# Patient Record
Sex: Female | Born: 1975
Health system: Southern US, Community
[De-identification: ages and names within clinical notes are randomized; demographics above are authoritative.]

## PROBLEM LIST (undated history)

## (undated) DIAGNOSIS — J45909 Unspecified asthma, uncomplicated: Secondary | ICD-10-CM

## (undated) DIAGNOSIS — T7840XA Allergy, unspecified, initial encounter: Secondary | ICD-10-CM

## (undated) DIAGNOSIS — K259 Gastric ulcer, unspecified as acute or chronic, without hemorrhage or perforation: Secondary | ICD-10-CM

## (undated) DIAGNOSIS — Z5189 Encounter for other specified aftercare: Secondary | ICD-10-CM

## (undated) DIAGNOSIS — D649 Anemia, unspecified: Secondary | ICD-10-CM

## (undated) DIAGNOSIS — A90 Dengue fever [classical dengue]: Secondary | ICD-10-CM

## (undated) HISTORY — DX: Allergy, unspecified, initial encounter: T78.40XA

## (undated) HISTORY — PX: RHINOPLASTY: SHX2354

## (undated) HISTORY — DX: Unspecified asthma, uncomplicated: J45.909

## (undated) HISTORY — DX: Gastric ulcer, unspecified as acute or chronic, without hemorrhage or perforation: K25.9

## (undated) HISTORY — DX: Anemia, unspecified: D64.9

## (undated) HISTORY — DX: Encounter for other specified aftercare: Z51.89

## (undated) HISTORY — DX: Dengue fever (classical dengue): A90

## (undated) HISTORY — PX: COLONOSCOPY: SHX174

---

## 2020-03-05 ENCOUNTER — Ambulatory Visit (INDEPENDENT_AMBULATORY_CARE_PROVIDER_SITE_OTHER): Payer: No Typology Code available for payment source | Admitting: Family Medicine

## 2020-03-05 ENCOUNTER — Encounter: Payer: Self-pay | Admitting: Internal Medicine

## 2020-03-05 ENCOUNTER — Encounter: Payer: Self-pay | Admitting: Family Medicine

## 2020-03-05 ENCOUNTER — Other Ambulatory Visit: Payer: Self-pay

## 2020-03-05 ENCOUNTER — Other Ambulatory Visit (HOSPITAL_COMMUNITY)
Admission: RE | Admit: 2020-03-05 | Discharge: 2020-03-05 | Disposition: A | Discharge status: Home / Self Care | Payer: No Typology Code available for payment source | Source: Ambulatory Visit | Attending: Family Medicine | Admitting: Family Medicine

## 2020-03-05 VITALS — BP 141/92 | HR 87 | Wt 147.8 lb

## 2020-03-05 DIAGNOSIS — Z1211 Encounter for screening for malignant neoplasm of colon: Secondary | ICD-10-CM

## 2020-03-05 DIAGNOSIS — Z1231 Encounter for screening mammogram for malignant neoplasm of breast: Secondary | ICD-10-CM | POA: Insufficient documentation

## 2020-03-05 DIAGNOSIS — Z01419 Encounter for gynecological examination (general) (routine) without abnormal findings: Secondary | ICD-10-CM

## 2020-03-05 NOTE — Patient Instructions (Signed)

## 2020-03-05 NOTE — Progress Notes (Signed)
GYNECOLOGY ANNUAL PREVENTATIVE CARE ENCOUNTER NOTE  Subjective:   Kathleen Doyle is a 44 y.o. G0P0000 female here for a annual gynecologic exam. Current complaints: constipation, for which she would like a GI referral.   Denies abnormal vaginal bleeding, discharge, pelvic pain, problems with intercourse or other gynecologic concerns.    Gynecologic History Patient's last menstrual period was 02/13/2020. Contraception: abstinence Last Pap: 2017. Results were: normal Last mammogram: 2017. Results were: normal (Family history of Breast Cancer, personal history fibrocystic breasts) Gardisil: not elligible  Obstetric History OB History  Gravida Para Term Preterm AB Living  0 0 0 0 0 0  SAB TAB Ectopic Multiple Live Births  0 0 0 0 0    Past Medical History:  Diagnosis Date  . Asthma     History reviewed. No pertinent surgical history.  Current Outpatient Medications on File Prior to Visit  Medication Sig Dispense Refill  . acetaminophen (TYLENOL) 325 MG tablet Take 650 mg by mouth every 6 (six) hours as needed.    Marland Kitchen ibuprofen (ADVIL) 200 MG tablet Take 400 mg by mouth every 6 (six) hours as needed.    . Magnesium Hydroxide (DULCOLAX PO) Take by mouth.    . Multiple Vitamin (MULTIVITAMIN PO) Take by mouth.     No current facility-administered medications on file prior to visit.    Allergies  Allergen Reactions  . Merthiolate [Thimerosal] Rash  . Penicillins Rash    Social History   Socioeconomic History  . Marital status: Divorced    Spouse name: Not on file  . Number of children: Not on file  . Years of education: Not on file  . Highest education level: Not on file  Occupational History  . Occupation: Surveyor, quantity: [employer]  Tobacco Use  . Smoking status: Never Smoker  . Smokeless tobacco: Never Used  Substance and Sexual Activity  . Alcohol use: Yes    Comment: occasionally  . Drug use: Never  . Sexual activity: Not Currently   Other Topics Concern  . Not on file  Social History Narrative  . Not on file   Social Determinants of Health   Financial Resource Strain:   . Difficulty of Paying Living Expenses:   Food Insecurity:   . Worried About Charity fundraiser in the Last Year:   . Arboriculturist in the Last Year:   Transportation Needs:   . Film/video editor (Medical):   Marland Kitchen Lack of Transportation (Non-Medical):   Physical Activity:   . Days of Exercise per Week:   . Minutes of Exercise per Session:   Stress:   . Feeling of Stress :   Social Connections:   . Frequency of Communication with Friends and Family:   . Frequency of Social Gatherings with Friends and Family:   . Attends Religious Services:   . Active Member of Clubs or Organizations:   . Attends Archivist Meetings:   Marland Kitchen Marital Status:   Intimate Partner Violence:   . Fear of Current or Ex-Partner:   . Emotionally Abused:   Marland Kitchen Physically Abused:   . Sexually Abused:     Family History  Problem Relation Age of Onset  . Kidney Stones Mother   . Heart disease Father   . Lung cancer Paternal Grandfather   . Brain cancer Paternal Grandfather   . Breast cancer Maternal Aunt   . Breast cancer Paternal Aunt     Diet:  mostly bread and rice Exercise: reports  The following portions of the patient's history were reviewed and updated as appropriate: allergies, current medications, past family history, past medical history, past social history, past surgical history and problem list.  Review of Systems Pertinent items are noted in HPI.   Objective:  BP (!) 141/92   Pulse 87   Wt 147 lb 12.8 oz (67 kg)   LMP 02/13/2020  CONSTITUTIONAL: Well-developed, well-nourished female in no acute distress.  HENT:  Normocephalic, atraumatic, External right and left ear normal. Oropharynx is clear and moist EYES: Conjunctivae and EOM are normal. Pupils are equal, round, and reactive to light. No scleral icterus.  NECK: Normal range  of motion, supple, no masses.  Normal thyroid.  SKIN: Skin is warm and dry. No rash noted. Not diaphoretic. No erythema. No pallor. NEUROLOGIC: Alert and oriented to person, place, and time. Normal reflexes, muscle tone coordination. No cranial nerve deficit noted. PSYCHIATRIC: Normal mood and affect. Normal behavior. Normal judgment and thought content. CARDIOVASCULAR: Normal heart rate noted, regular rhythm RESPIRATORY: Clear to auscultation bilaterally. Effort and breath sounds normal, no problems with respiration noted. BREASTS: Symmetric in size. No masses, skin changes, nipple drainage, or lymphadenopathy. ABDOMEN: Soft, normal bowel sounds, no distention noted.  No tenderness, rebound or guarding.  PELVIC: Normal appearing external genitalia; normal appearing vaginal mucosa and cervix.  No abnormal discharge noted.  Pap smear obtained.  Normal uterine size, no other palpable masses, no uterine or adnexal tenderness. MUSCULOSKELETAL: Normal range of motion. No tenderness.  No cyanosis, clubbing, or edema.  2+ distal pulses.  Exam done with chaperone present.  Assessment and Plan:   1. Encounter for screening mammogram for breast cancer - MM 3D SCREEN BREAST BILATERAL; Future - Cytology - PAP( [place])  2. Encounter for annual routine gynecological examination Will follow up results of pap smear and manage accordingly. Encouraged improvement in diet and exercise.  -Mammogram ordered -Flu vaccine: already had for 2020-2021 -Colonoscopy: referred -Gardisil n/a  Routine preventative health maintenance measures emphasized. Please refer to After Visit Summary for other counseling recommendations.   Total face-to-face time with patient: 13 minutes. Over 50% of encounter was spent on counseling and coordination of care.   Merilyn Baba, DO OB Fellow, [facility] 03/05/2020 9:44 AM

## 2020-03-06 ENCOUNTER — Other Ambulatory Visit: Payer: Self-pay | Admitting: Family Medicine

## 2020-03-06 DIAGNOSIS — Z1231 Encounter for screening mammogram for malignant neoplasm of breast: Secondary | ICD-10-CM

## 2020-03-07 LAB — CYTOLOGY - PAP
Comment: NEGATIVE
Diagnosis: NEGATIVE
High risk HPV: NEGATIVE

## 2020-03-10 ENCOUNTER — Ambulatory Visit
Admission: RE | Admit: 2020-03-10 | Discharge: 2020-03-10 | Disposition: A | Discharge status: Home / Self Care | Payer: No Typology Code available for payment source | Source: Ambulatory Visit | Attending: Family Medicine | Admitting: Family Medicine

## 2020-03-10 ENCOUNTER — Other Ambulatory Visit: Payer: Self-pay

## 2020-03-10 DIAGNOSIS — Z1231 Encounter for screening mammogram for malignant neoplasm of breast: Secondary | ICD-10-CM

## 2020-03-12 DIAGNOSIS — Z8711 Personal history of peptic ulcer disease: Secondary | ICD-10-CM | POA: Insufficient documentation

## 2020-03-12 DIAGNOSIS — R030 Elevated blood-pressure reading, without diagnosis of hypertension: Secondary | ICD-10-CM | POA: Insufficient documentation

## 2020-03-12 DIAGNOSIS — J4520 Mild intermittent asthma, uncomplicated: Secondary | ICD-10-CM | POA: Insufficient documentation

## 2020-03-21 ENCOUNTER — Other Ambulatory Visit: Payer: Self-pay

## 2020-03-21 ENCOUNTER — Ambulatory Visit (AMBULATORY_SURGERY_CENTER): Payer: Self-pay | Admitting: *Deleted

## 2020-03-21 VITALS — Temp 97.8°F | Ht 60.0 in | Wt 145.4 lb

## 2020-03-21 DIAGNOSIS — Z1211 Encounter for screening for malignant neoplasm of colon: Secondary | ICD-10-CM

## 2020-03-21 MED ORDER — SUPREP BOWEL PREP KIT 17.5-3.13-1.6 GM/177ML PO SOLN: 1.0000 | Freq: Once | ORAL | 0 refills | Status: AC

## 2020-03-21 NOTE — Progress Notes (Signed)
Patient denies any allergies to egg or soy products. Patient denies complications with anesthesia/sedation.  Patient denies oxygen use at home and denies diet medications. Emmi instructions for colonoscopy explained and given to patient.  Patient had both covid vaccines, last one on 01/14/20.

## 2020-04-04 ENCOUNTER — Encounter: Payer: Self-pay | Admitting: Internal Medicine

## 2020-04-04 ENCOUNTER — Ambulatory Visit (AMBULATORY_SURGERY_CENTER): Payer: No Typology Code available for payment source | Admitting: Internal Medicine

## 2020-04-04 ENCOUNTER — Other Ambulatory Visit: Payer: Self-pay

## 2020-04-04 VITALS — BP 122/81 | HR 76 | Temp 96.6°F | Resp 19 | Ht 60.0 in | Wt 145.0 lb

## 2020-04-04 DIAGNOSIS — D122 Benign neoplasm of ascending colon: Secondary | ICD-10-CM

## 2020-04-04 DIAGNOSIS — Z1211 Encounter for screening for malignant neoplasm of colon: Secondary | ICD-10-CM

## 2020-04-04 MED ORDER — SODIUM CHLORIDE 0.9 % IV SOLN: 500.0000 mL | Freq: Once | INTRAVENOUS | Status: DC

## 2020-04-04 NOTE — Progress Notes (Signed)
Report to PACU, RN, vss, BBS= Clear.  

## 2020-04-04 NOTE — Progress Notes (Signed)
Pt's states no medical or surgical changes since previsit or office visit. 

## 2020-04-04 NOTE — Progress Notes (Signed)
Called to room to assist during endoscopic procedure.  Patient ID and intended procedure confirmed with present staff. Received instructions for my participation in the procedure from the performing physician.  

## 2020-04-04 NOTE — Patient Instructions (Signed)
HANDOUTS PROVIDED ON: POLYPS  The polyp removed today have been sent for pathology.  The results can take 1-3 weeks to receive.  When your next colonoscopy should occur will be based on the pathology results.    You may resume your previous diet and medication schedule.  Thank you for allowing us to care for you today!!!   YOU HAD AN ENDOSCOPIC PROCEDURE TODAY AT THE [facility]:   Refer to the procedure report that was given to you for any specific questions about what was found during the examination.  If the procedure report does not answer your questions, please call your gastroenterologist to clarify.  If you requested that your care partner not be given the details of your procedure findings, then the procedure report has been included in a sealed envelope for you to review at your convenience later.  YOU SHOULD EXPECT: Some feelings of bloating in the abdomen. Passage of more gas than usual.  Walking can help get rid of the air that was put into your GI tract during the procedure and reduce the bloating. If you had a lower endoscopy (such as a colonoscopy or flexible sigmoidoscopy) you may notice spotting of blood in your stool or on the toilet paper. If you underwent a bowel prep for your procedure, you may not have a normal bowel movement for a few days.  Please Note:  You might notice some irritation and congestion in your nose or some drainage.  This is from the oxygen used during your procedure.  There is no need for concern and it should clear up in a day or so.  SYMPTOMS TO REPORT IMMEDIATELY:   Following lower endoscopy (colonoscopy or flexible sigmoidoscopy):  Excessive amounts of blood in the stool  Significant tenderness or worsening of abdominal pains  Swelling of the abdomen that is new, acute  Fever of 100F or higher  For urgent or emergent issues, a gastroenterologist can be reached at any hour by calling [phone]. Do not use MyChart messaging for  urgent concerns.    DIET:  We do recommend a small meal at first, but then you may proceed to your regular diet.  Drink plenty of fluids but you should avoid alcoholic beverages for 24 hours.  ACTIVITY:  You should plan to take it easy for the rest of today and you should NOT DRIVE or use heavy machinery until tomorrow (because of the sedation medicines used during the test).    FOLLOW UP: Our staff will call the number listed on your records 48-72 hours following your procedure to check on you and address any questions or concerns that you may have regarding the information given to you following your procedure. If we do not reach you, we will leave a message.  We will attempt to reach you two times.  During this call, we will ask if you have developed any symptoms of COVID 19. If you develop any symptoms (ie: fever, flu-like symptoms, shortness of breath, cough etc.) before then, please call [phone].  If you test positive for Covid 19 in the 2 weeks post procedure, please call and report this information to us.    If any biopsies were taken you will be contacted by phone or by letter within the next 1-3 weeks.  Please call us at [phone] if you have not heard about the biopsies in 3 weeks.    SIGNATURES/CONFIDENTIALITY: You and/or your care partner have signed paperwork which will be entered into   your electronic medical record.  These signatures attest to the fact that that the information above on your After Visit Summary has been reviewed and is understood.  Full responsibility of the confidentiality of this discharge information lies with you and/or your care-partner.

## 2020-04-04 NOTE — Op Note (Signed)
[facility] Patient Name: Kathleen Doyle Procedure Date: 04/04/2020 1:47 PM MRN: [id number] Endoscopist: Jerene Bears , MD Age: 44 Referring MD:  Date of Birth: 02/22/1976 Gender: Female Account #: [id number] Procedure:                Colonoscopy Indications:              Screening for colorectal malignant neoplasm, This                            is the patient's first colonoscopy Medicines:                Monitored Anesthesia Care Procedure:                Pre-Anesthesia Assessment:                           - Prior to the procedure, a History and Physical                            was performed, and patient medications and                            allergies were reviewed. The patient's tolerance of                            previous anesthesia was also reviewed. The risks                            and benefits of the procedure and the sedation                            options and risks were discussed with the patient.                            All questions were answered, and informed consent                            was obtained. Prior Anticoagulants: The patient has                            taken no previous anticoagulant or antiplatelet                            agents. ASA Grade Assessment: II - A patient with                            mild systemic disease. After reviewing the risks                            and benefits, the patient was deemed in                            satisfactory condition to undergo the procedure.  After obtaining informed consent, the colonoscope                            was passed under direct vision. Throughout the                            procedure, the patient's blood pressure, pulse, and                            oxygen saturations were monitored continuously. The                            Colonoscope was introduced through the anus and                            advanced to the cecum,  identified by appendiceal                            orifice and ileocecal valve. The colonoscopy was                            performed without difficulty. The patient tolerated                            the procedure well. The quality of the bowel                            preparation was good. The ileocecal valve,                            appendiceal orifice, and rectum were photographed. Scope In: 1:51:47 PM Scope Out: 2:06:53 PM Scope Withdrawal Time: 0 hours 12 minutes 0 seconds  Total Procedure Duration: 0 hours 15 minutes 6 seconds  Findings:                 The digital rectal exam was normal.                           A 10 mm polyp was found in the ascending colon. The                            polyp was sessile. The polyp was removed with a                            cold snare. Resection and retrieval were complete.                           The exam was otherwise without abnormality on                            direct and retroflexion views. Complications:            No immediate complications. Estimated Blood Loss:     Estimated blood loss was minimal. Impression:               -  One 10 mm polyp in the ascending colon, removed                            with a cold snare. Resected and retrieved.                           - The examination was otherwise normal on direct                            and retroflexion views. Recommendation:           - Patient has a contact number available for                            emergencies. The signs and symptoms of potential                            delayed complications were discussed with the                            patient. Return to normal activities tomorrow.                            Written discharge instructions were provided to the                            patient.                           - Resume previous diet.                           - Continue present medications.                           - Await  pathology results.                           - Repeat colonoscopy is recommended. The                            colonoscopy date will be determined after pathology                            results from today's exam become available for                            review. Jerene Bears, MD 04/04/2020 2:08:20 PM This report has been signed electronically.

## 2020-04-08 ENCOUNTER — Telehealth: Payer: Self-pay | Admitting: *Deleted

## 2020-04-08 NOTE — Telephone Encounter (Signed)
  Follow up Call-  Call back number 04/04/2020  Post procedure Call Back phone  # [phone]  Permission to leave phone message Yes     Patient questions:  Do you have a fever, pain , or abdominal swelling? No. Pain Score  0 *  Have you tolerated food without any problems? Yes.    Have you been able to return to your normal activities? Yes.    Do you have any questions about your discharge instructions: Diet   No. Medications  No. Follow up visit  No.  Do you have questions or concerns about your Care? No.  Actions: * If pain score is 4 or above: No action needed, pain <4.  Have you developed a fever since your procedure? NO  2.   Have you had an respiratory symptoms (SOB or cough) since your procedure? NO  3.   Have you tested positive for COVID 19 since your procedure NO  4.   Have you had any family members/close contacts diagnosed with the COVID 19 since your procedure?  NO   If yes to any of these questions please route to Joylene John, RN and Erenest Rasher, RN

## 2020-04-08 NOTE — Telephone Encounter (Signed)
  Follow up Call-  Call back number 04/04/2020  Post procedure Call Back phone  # [phone]  Permission to leave phone message Yes     Patient questions:  Message left to call us if necessary.

## 2020-04-14 ENCOUNTER — Encounter: Payer: Self-pay | Admitting: Internal Medicine

## 2021-03-18 ENCOUNTER — Other Ambulatory Visit: Payer: Self-pay | Admitting: Nurse Practitioner

## 2021-03-18 DIAGNOSIS — Z1231 Encounter for screening mammogram for malignant neoplasm of breast: Secondary | ICD-10-CM

## 2021-04-10 ENCOUNTER — Encounter: Payer: Self-pay | Admitting: Nurse Practitioner

## 2021-04-10 ENCOUNTER — Ambulatory Visit (INDEPENDENT_AMBULATORY_CARE_PROVIDER_SITE_OTHER): Payer: No Typology Code available for payment source | Admitting: Nurse Practitioner

## 2021-04-10 VITALS — BP 123/84 | HR 95 | Wt 143.3 lb

## 2021-04-10 DIAGNOSIS — Z01419 Encounter for gynecological examination (general) (routine) without abnormal findings: Secondary | ICD-10-CM

## 2021-04-10 DIAGNOSIS — Z6828 Body mass index (BMI) 28.0-28.9, adult: Secondary | ICD-10-CM | POA: Diagnosis not present

## 2021-04-10 DIAGNOSIS — Z860100 Personal history of colon polyps, unspecified: Secondary | ICD-10-CM | POA: Insufficient documentation

## 2021-04-10 NOTE — Progress Notes (Signed)
GYNECOLOGY ANNUAL PREVENTATIVE CARE ENCOUNTER NOTE  Subjective:   Kathleen Doyle is a 45 y.o. G0P0000 female here for a routine annual gynecologic exam.  Current complaints: aunts have been diagnosed with breast cancer - worried but has mammogram scheduled.  Previously has had ultrasounds with her mammogram.   Denies abnormal vaginal bleeding, discharge, pelvic pain, problems with intercourse or other gynecologic concerns.    Gynecologic History Patient's last menstrual period was 03/29/2021. Contraception: abstinence Last Pap: 2021. Results were: normal Last mammogram: 02-2020. Results were: normal  Obstetric History OB History  Gravida Para Term Preterm AB Living  0 0 0 0 0 0  SAB IAB Ectopic Multiple Live Births  0 0 0 0 0    Past Medical History:  Diagnosis Date  . Allergy   . Anemia   . Asthma    as a child, no inhaler no problems as an adult   . Dengue fever    hospitalized 2 weeks  . Multiple gastric ulcers     Past Surgical History:  Procedure Laterality Date  . RHINOPLASTY      Current Outpatient Medications on File Prior to Visit  Medication Sig Dispense Refill  . acetaminophen (TYLENOL) 325 MG tablet Take 650 mg by mouth every 6 (six) hours as needed.    Marland Kitchen ibuprofen (ADVIL) 200 MG tablet Take 400 mg by mouth every 6 (six) hours as needed.    . linaclotide (LINZESS) 72 MCG capsule Take 72 mcg by mouth daily before breakfast.    . Multiple Vitamin (MULTIVITAMIN PO) Take by mouth.     No current facility-administered medications on file prior to visit.    Allergies  Allergen Reactions  . Merthiolate [Thimerosal] Rash  . Penicillins Rash    Social History   Socioeconomic History  . Marital status: Divorced    Spouse name: Not on file  . Number of children: Not on file  . Years of education: Not on file  . Highest education level: Not on file  Occupational History  . Occupation: Surveyor, quantity: House  Tobacco Use   . Smoking status: Never Smoker  . Smokeless tobacco: Never Used  Vaping Use  . Vaping Use: Never used  Substance and Sexual Activity  . Alcohol use: Yes    Comment: occasionally  . Drug use: Never  . Sexual activity: Not Currently  Other Topics Concern  . Not on file  Social History Narrative  . Not on file   Social Determinants of Health   Financial Resource Strain: Not on file  Food Insecurity: Not on file  Transportation Needs: Not on file  Physical Activity: Not on file  Stress: Not on file  Social Connections: Not on file  Intimate Partner Violence: Not on file    Family History  Problem Relation Age of Onset  . Kidney Stones Mother   . Heart disease Father   . Lung cancer Paternal Grandfather   . Brain cancer Paternal Grandfather   . Breast cancer Maternal Aunt   . Breast cancer Paternal Aunt   . Colon cancer Neg Hx   . Stomach cancer Neg Hx   . Rectal cancer Neg Hx     The following portions of the patient's history were reviewed and updated as appropriate: allergies, current medications, past family history, past medical history, past social history, past surgical history and problem list.  Review of Systems Pertinent items noted in HPI and remainder of comprehensive ROS  otherwise negative.   Objective:  BP 123/84   Pulse 95   Wt 143 lb 4.8 oz (65 kg)   LMP 03/29/2021   BMI 27.99 kg/m  CONSTITUTIONAL: Well-developed, well-nourished female in no acute distress.  HENT:  Normocephalic, atraumatic, External right and left ear normal.  EYES: Conjunctivae and EOM are normal. Pupils are equal, round.  No scleral icterus.  NECK: Normal range of motion, supple, no masses.  Normal thyroid.  SKIN: Skin is warm and dry. No rash noted. Not diaphoretic. No erythema. No pallor. NEUROLOGIC: Alert and oriented to person, place, and time. Normal reflexes, muscle tone coordination. No cranial nerve deficit noted. PSYCHIATRIC: Normal mood and affect. Normal behavior.  Normal judgment and thought content. CARDIOVASCULAR: Normal heart rate noted, regular rhythm RESPIRATORY: Clear to auscultation bilaterally. Effort and breath sounds normal, no problems with respiration noted. BREASTS: Symmetric in size. No masses, skin changes, nipple drainage, or lymphadenopathy. ABDOMEN: Soft, no distention noted.  No tenderness, rebound or guarding.  PELVIC: deferred MUSCULOSKELETAL: Normal range of motion. No tenderness.  No cyanosis, clubbing, or edema.    Assessment and Plan:  1. Encounter for well woman exam with routine gynecological exam Sees PCP who is also watching her BP - First BP today was elevated.  Recheck was normal. Two aunts have breast cancer - has appt for mammogram and to see cardiology Breast exam normal so no indication for ultrasound with mammogram.  Advised mammography will let us know if there is a need for ultrasound. Works as Presenter, broadcasting at [employer]  2. BMI 28.0-28.9,adult Weight loss advised  Will follow up results of pap smear and manage accordingly. Mammogram scheduled Routine preventative health maintenance measures emphasized. Please refer to After Visit Summary for other counseling recommendations.    Earlie Server, RN, MSN, NP-BC Nurse Practitioner, [place] for [facility]

## 2021-05-07 ENCOUNTER — Ambulatory Visit
Admission: RE | Admit: 2021-05-07 | Discharge: 2021-05-07 | Disposition: A | Discharge status: Home / Self Care | Payer: No Typology Code available for payment source | Source: Ambulatory Visit | Attending: Nurse Practitioner | Admitting: Nurse Practitioner

## 2021-05-07 ENCOUNTER — Other Ambulatory Visit: Payer: Self-pay

## 2021-05-07 DIAGNOSIS — Z1231 Encounter for screening mammogram for malignant neoplasm of breast: Secondary | ICD-10-CM

## 2021-05-11 ENCOUNTER — Other Ambulatory Visit: Payer: Self-pay | Admitting: Nurse Practitioner

## 2021-05-11 DIAGNOSIS — R928 Other abnormal and inconclusive findings on diagnostic imaging of breast: Secondary | ICD-10-CM

## 2021-06-04 ENCOUNTER — Ambulatory Visit
Admission: RE | Admit: 2021-06-04 | Discharge: 2021-06-04 | Disposition: A | Discharge status: Home / Self Care | Payer: No Typology Code available for payment source | Source: Ambulatory Visit | Attending: Nurse Practitioner | Admitting: Nurse Practitioner

## 2021-06-04 ENCOUNTER — Other Ambulatory Visit: Payer: Self-pay

## 2021-06-04 DIAGNOSIS — R928 Other abnormal and inconclusive findings on diagnostic imaging of breast: Secondary | ICD-10-CM

## 2021-06-05 ENCOUNTER — Ambulatory Visit: Payer: No Typology Code available for payment source | Admitting: Cardiology

## 2021-06-05 ENCOUNTER — Encounter: Payer: Self-pay | Admitting: Cardiology

## 2021-06-05 VITALS — BP 120/90 | HR 86 | Ht 62.0 in | Wt 151.0 lb

## 2021-06-05 DIAGNOSIS — Z8249 Family history of ischemic heart disease and other diseases of the circulatory system: Secondary | ICD-10-CM | POA: Diagnosis not present

## 2021-06-05 DIAGNOSIS — Z8349 Family history of other endocrine, nutritional and metabolic diseases: Secondary | ICD-10-CM

## 2021-06-05 NOTE — Patient Instructions (Signed)
Medication Instructions:  Your physician recommends that you continue on your current medications as directed. Please refer to the Current Medication list given to you today.  *If you need a refill on your cardiac medications before your next appointment, please call your pharmacy*  Testing/Procedures: Your physician has requested that you have a cardiac MRI. Cardiac MRI uses a computer to create images of your heart as its beating, producing both still and moving pictures of your heart and major blood vessels. For further information please visit [email or web address]-[email or web address] Please follow the instruction sheet given to you today for more information.  Your physician has requested that you have Calcium score CT. Cardiac computed tomography (CT) is a painless test that uses an x-ray machine to take clear, detailed pictures of your heart. For further information please visit HugeFiesta.[email or web address]. Please follow instruction sheet as given.   Follow-Up: At [facility], you and your health needs are our priority.  As part of our continuing mission to provide you with exceptional heart care, we have created designated Provider Care Teams.  These Care Teams include your primary Cardiologist (physician) and Advanced Practice Providers (APPs -  Physician Assistants and Nurse Practitioners) who all work together to provide you with the care you need, when you need it.  Follow up with Dr. Radford Pax based on results of testing.

## 2021-06-05 NOTE — Progress Notes (Signed)
Cardiology CONSULT Note    Date:  06/05/2021   ID:  Kathleen Doyle, DOB [birth year], MRN [id number]  PCP:  Kathleen Boston, MD  Cardiologist:  Kathleen Him, MD   Chief Complaint  Patient presents with   New Patient (Initial Visit)    Family history of premature CAD and amyloidosis     History of Present Illness:  Kathleen Doyle is a 45 y.o. female who is being seen today for the evaluation of family history of CAD and  at the request of Kathleen, Jesse Sans, MD.    This is a 45yo female with a hx of asthma who is referred due to a strong family hx of premature CAD and cardiac amyloidosis.  Her father was dx with CAD in his 22's and then cardiac amyloid.  She has not had any cardiac symptoms and denies any chest pain or pressure, SOB, DOE, PND, orthopnea, LE edema, dizziness, palpitations or syncope.  She has never smoked.   Past Medical History:  Diagnosis Date   Allergy    Anemia    Asthma    as a child, no inhaler no problems as an adult    Dengue fever    hospitalized 2 weeks   Multiple gastric ulcers     Past Surgical History:  Procedure Laterality Date   RHINOPLASTY      Current Medications: Current Meds  Medication Sig   acetaminophen (TYLENOL) 325 MG tablet Take 650 mg by mouth every 6 (six) hours as needed.   ibuprofen (ADVIL) 200 MG tablet Take 400 mg by mouth every 6 (six) hours as needed.   linaclotide (LINZESS) 72 MCG capsule Take 72 mcg by mouth daily before breakfast.   Multiple Vitamin (MULTIVITAMIN PO) Take by mouth.    Allergies:   Merthiolate [thimerosal] and Penicillins   Social History   Socioeconomic History   Marital status: Divorced    Spouse name: Not on file   Number of children: Not on file   Years of education: Not on file   Highest education level: Not on file  Occupational History   Occupation: Registered Nurse    Employer: [employer]  Tobacco Use   Smoking status: Never   Smokeless tobacco: Never  Vaping Use    Vaping Use: Never used  Substance and Sexual Activity   Alcohol use: Yes    Comment: occasionally   Drug use: Never   Sexual activity: Not Currently  Other Topics Concern   Not on file  Social History Narrative   Not on file   Social Determinants of Health   Financial Resource Strain: Not on file  Food Insecurity: Not on file  Transportation Needs: Not on file  Physical Activity: Not on file  Stress: Not on file  Social Connections: Not on file     Family History:  The patient's family history includes Brain cancer in her paternal grandfather; Breast cancer in her maternal aunt and paternal aunt; Heart disease in her father; Kidney Stones in her mother; Lung cancer in her paternal grandfather.   ROS:   Please see the history of present illness.    ROS All other systems reviewed and are negative.  No flowsheet data found.     PHYSICAL EXAM:   VS:  BP 120/90   Pulse 86   Ht 5\' 2"  (1.575 m)   Wt 151 lb (68.5 kg)   SpO2 98%   BMI 27.62 kg/m    GEN: Well  nourished, well developed, in no acute distress  HEENT: normal  Neck: no JVD, carotid bruits, or masses Cardiac: RRR; no murmurs, rubs, or gallops,no edema.  Intact distal pulses bilaterally.  Respiratory:  clear to auscultation bilaterally, normal work of breathing GI: soft, nontender, nondistended, + BS MS: no deformity or atrophy  Skin: warm and dry, no rash Neuro:  Alert and Oriented x 3, Strength and sensation are intact Psych: euthymic mood, full affect  Wt Readings from Last 3 Encounters:  06/05/21 151 lb (68.5 kg)  04/10/21 143 lb 4.8 oz (65 kg)  04/04/20 145 lb (65.8 kg)      Studies/Labs Reviewed:   EKG:  EKG is ordered today.  The ekg ordered today demonstrates NSR with no ST changes  Recent Labs: No results found for requested labs within last 8760 hours.   Lipid Panel No results found for: CHOL, TRIG, HDL, CHOLHDL, VLDL, LDLCALC, LDLDIRECT  Additional studies/ records that were reviewed  today include:  EKG    ASSESSMENT:    1. Family history of early CAD   2. Family history of amyloidosis      PLAN:  In order of problems listed above:  Family hx of premature CAD -she is asymptomatic so I have recommended getting a coronary Ca score to assess future cardiac risk  2.  Family hx of cardiac amyloidosis -unclear if this was TTR amyloid -I will get a cardiac MRI to assess for structural heart disease and infiltrative myocardial disease  Time Spent: 30 minutes total time of encounter, including 20 minutes spent in face-to-face patient care on the date of this encounter. This time includes coordination of care and counseling regarding above mentioned problem list. Remainder of non-face-to-face time involved reviewing chart documents/testing relevant to the patient encounter and documentation in the medical record. I have independently reviewed documentation from referring provider  Medication Adjustments/Labs and Tests Ordered: Current medicines are reviewed at length with the patient today.  Concerns regarding medicines are outlined above.  Medication changes, Labs and Tests ordered today are listed in the Patient Instructions below.  There are no Patient Instructions on file for this visit.   Signed, Kathleen Him, MD  06/05/2021 8:57 AM    [facility] [address], [place], [place]  [address] Phone: [phone]; Fax: [phone]

## 2021-07-01 ENCOUNTER — Other Ambulatory Visit: Payer: Self-pay

## 2021-07-01 ENCOUNTER — Ambulatory Visit (INDEPENDENT_AMBULATORY_CARE_PROVIDER_SITE_OTHER)
Admission: RE | Admit: 2021-07-01 | Discharge: 2021-07-01 | Disposition: A | Discharge status: Home / Self Care | Payer: Self-pay | Source: Ambulatory Visit | Attending: Cardiology | Admitting: Cardiology

## 2021-07-01 DIAGNOSIS — Z8249 Family history of ischemic heart disease and other diseases of the circulatory system: Secondary | ICD-10-CM

## 2021-07-16 ENCOUNTER — Telehealth: Payer: Self-pay | Admitting: Cardiology

## 2021-07-16 NOTE — Telephone Encounter (Signed)
  Per patient schedule message:  I have a scheduled cardiac MRI on 8/26, I called my insurance and they said to be able to avail this you must send an updated request  because the 1st you submitted last 7/12 already expired.  They said its only valid for 30 days.    Please send it as soon as and the Case # [id number] for your reference.    Thank you very much!

## 2021-07-22 ENCOUNTER — Telehealth (HOSPITAL_COMMUNITY): Payer: Self-pay | Admitting: Emergency Medicine

## 2021-07-22 ENCOUNTER — Other Ambulatory Visit: Payer: Self-pay

## 2021-07-22 ENCOUNTER — Other Ambulatory Visit: Payer: No Typology Code available for payment source | Admitting: *Deleted

## 2021-07-22 DIAGNOSIS — Z8249 Family history of ischemic heart disease and other diseases of the circulatory system: Secondary | ICD-10-CM

## 2021-07-22 DIAGNOSIS — Z8349 Family history of other endocrine, nutritional and metabolic diseases: Secondary | ICD-10-CM

## 2021-07-22 LAB — BASIC METABOLIC PANEL
BUN/Creatinine Ratio: 15 (ref 9–23)
BUN: 10 mg/dL (ref 6–24)
CO2: 28 mmol/L (ref 20–29)
Calcium: 9.5 mg/dL (ref 8.7–10.2)
Chloride: 104 mmol/L (ref 96–106)
Creatinine, Ser: 0.65 mg/dL (ref 0.57–1.00)
Glucose: 87 mg/dL (ref 65–99)
Potassium: 4.2 mmol/L (ref 3.5–5.2)
Sodium: 139 mmol/L (ref 134–144)
eGFR: 109 mL/min/{1.73_m2} (ref >59)

## 2021-07-22 LAB — CBC
Hematocrit: 39.3 % (ref 34.0–46.6)
Hemoglobin: 13.2 g/dL (ref 11.1–15.9)
MCH: 28.6 pg (ref 26.6–33.0)
MCHC: 33.6 g/dL (ref 31.5–35.7)
MCV: 85 fL (ref 79–97)
Platelets: 282 10*3/uL (ref 150–450)
RBC: 4.62 x10E6/uL (ref 3.77–5.28)
RDW: 13.5 % (ref 11.7–15.4)
WBC: 7.2 10*3/uL (ref 3.4–10.8)

## 2021-07-22 NOTE — Telephone Encounter (Signed)
Attempted to call patient regarding upcoming cardiac CT appointment. °Left message on voicemail with name and callback number °Zakiya Sporrer RN Navigator Cardiac Imaging °[facility] °[phone] Office °[phone] Cell ° °

## 2021-07-24 ENCOUNTER — Ambulatory Visit (HOSPITAL_COMMUNITY)
Admission: RE | Admit: 2021-07-24 | Discharge: 2021-07-24 | Disposition: A | Discharge status: Home / Self Care | Payer: No Typology Code available for payment source | Source: Ambulatory Visit | Attending: Cardiology | Admitting: Cardiology

## 2021-07-24 ENCOUNTER — Encounter (HOSPITAL_COMMUNITY): Payer: Self-pay

## 2021-07-24 ENCOUNTER — Other Ambulatory Visit: Payer: Self-pay

## 2021-07-24 DIAGNOSIS — Z8349 Family history of other endocrine, nutritional and metabolic diseases: Secondary | ICD-10-CM

## 2021-07-24 DIAGNOSIS — Z8249 Family history of ischemic heart disease and other diseases of the circulatory system: Secondary | ICD-10-CM

## 2021-07-31 ENCOUNTER — Encounter: Payer: Self-pay | Admitting: *Deleted

## 2021-12-10 IMAGING — MG DIGITAL SCREENING BILAT W/ TOMO W/ CAD
6 of 10 series · 6 of 30 positions shown · non-contrast
Comparison: None.

CLINICAL DATA: Screening.

EXAM:
DIGITAL SCREENING BILATERAL MAMMOGRAM WITH TOMO AND CAD

[L CC synth-2D]
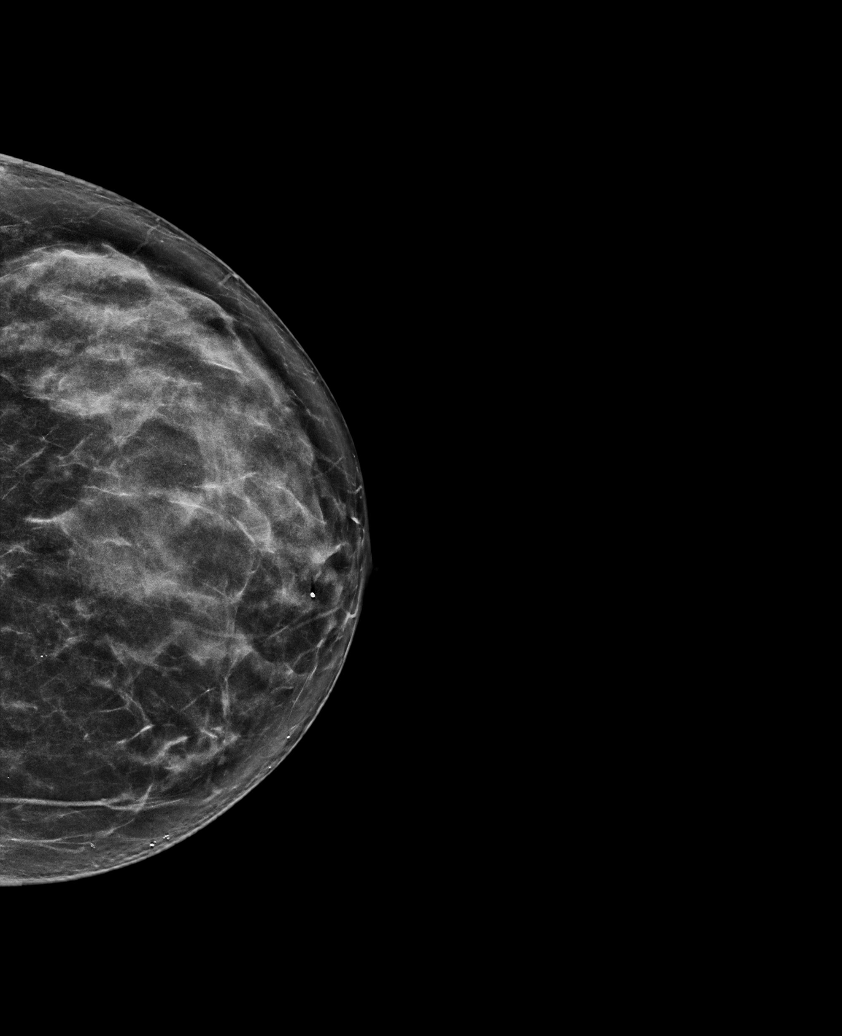

[R MLO synth-2D]
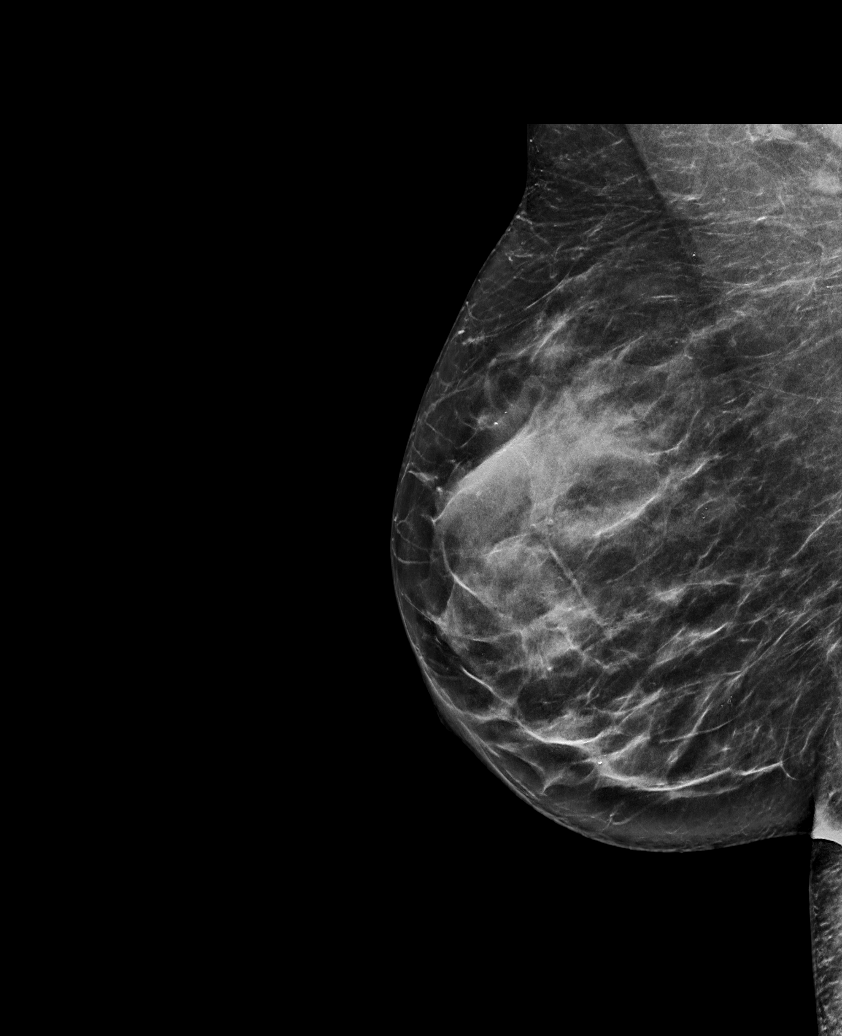

[R CC synth-2D (1 of 2)]
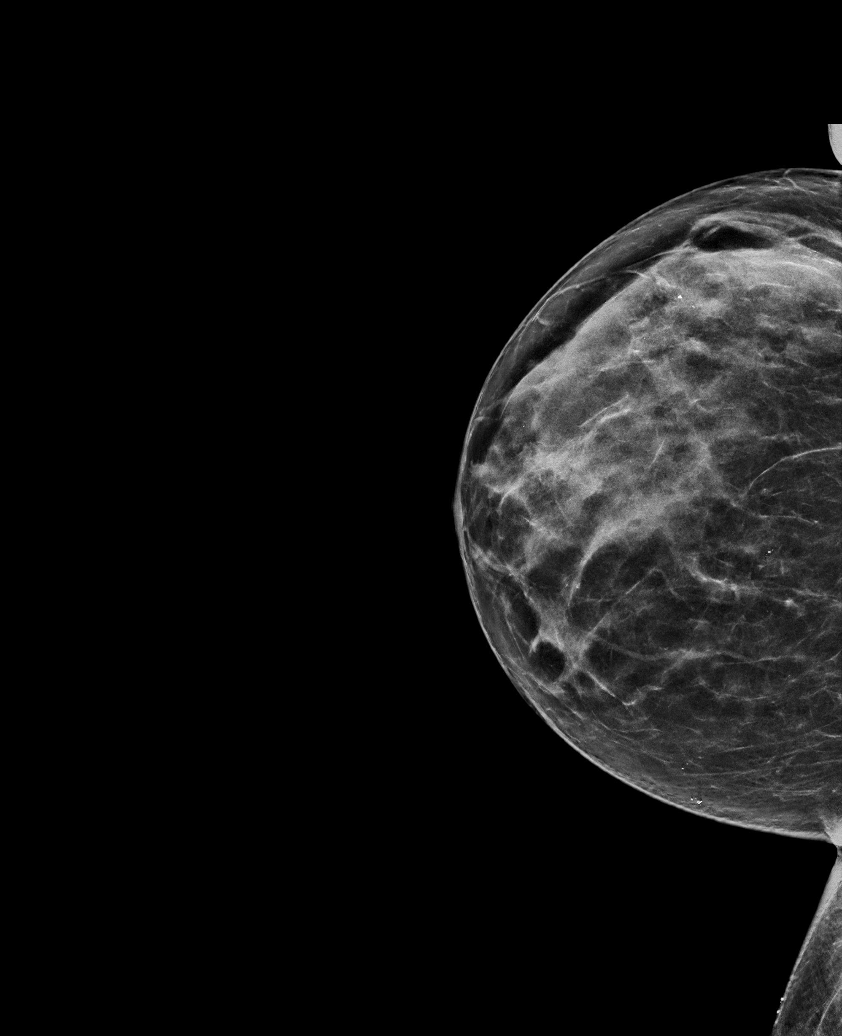

[L MLO synth-2D]
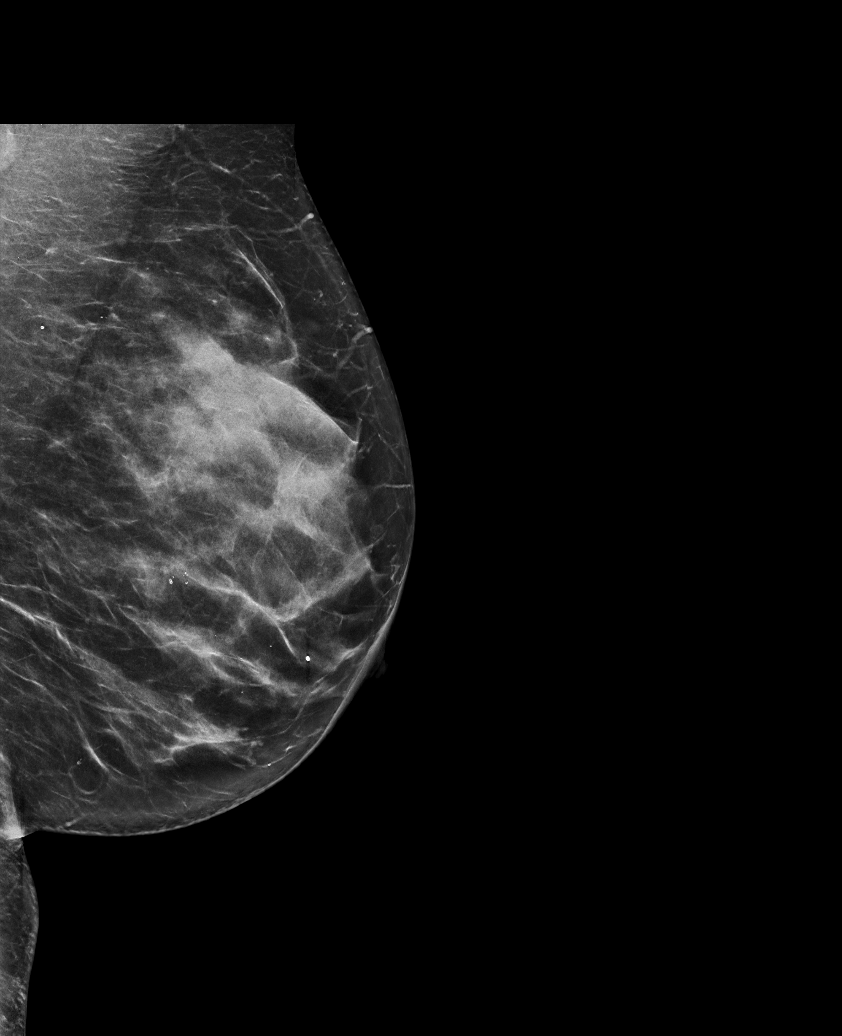

[R CC synth-2D (2 of 2)]
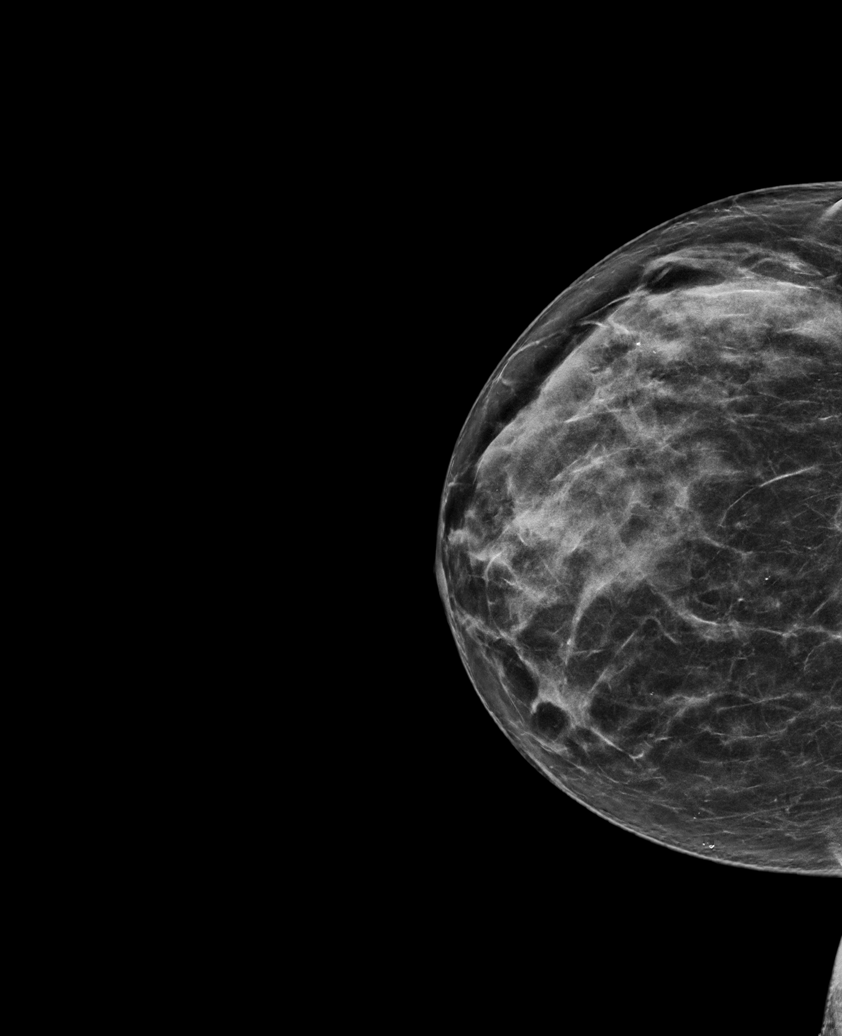

[L MLO tomo · tomo slice 39/76.0]
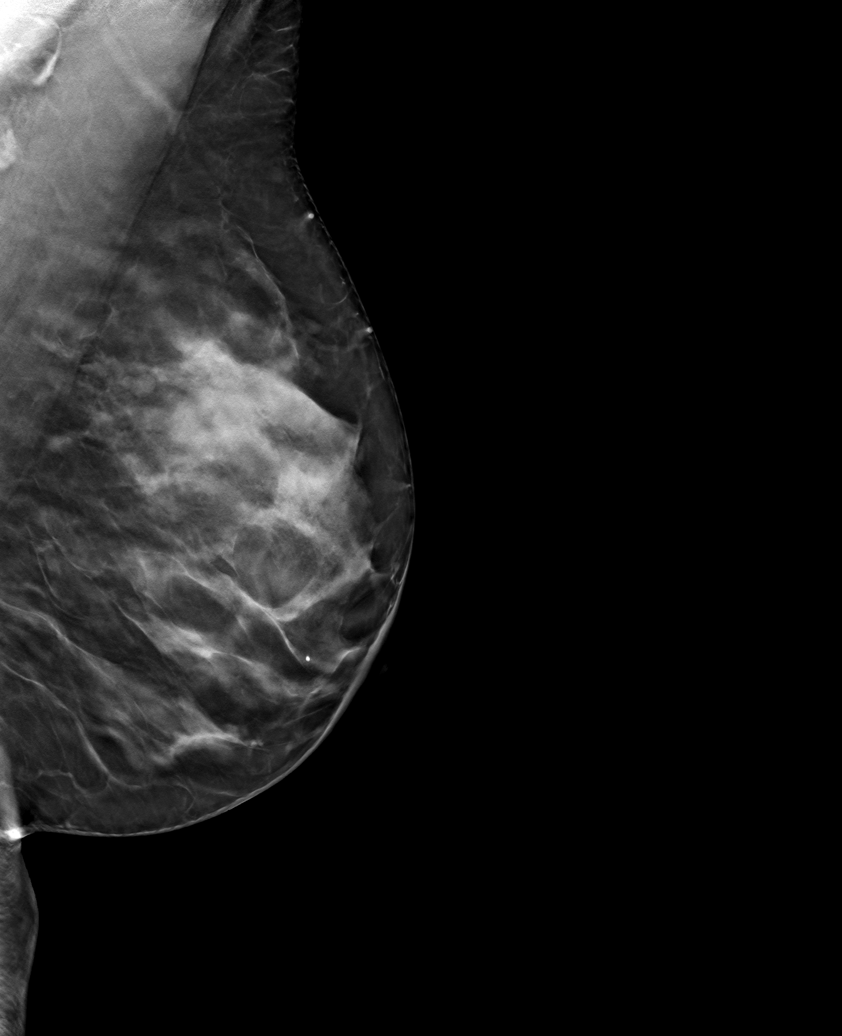

[6 of 30 positions shown; findings below may reference images not displayed]

ACR Breast Density Category c: The breast tissue is heterogeneously
dense, which may obscure small masses
FINDINGS: There are no findings suspicious for malignancy. Images were
processed with CAD.
IMPRESSION: No mammographic evidence of malignancy. A result letter of this
screening mammogram will be mailed directly to the patient.

RECOMMENDATION:
Screening mammogram in one year. (Code:[id number]-[id number])

BI-RADS CATEGORY  1: Negative.

## 2022-03-25 ENCOUNTER — Other Ambulatory Visit (HOSPITAL_COMMUNITY): Payer: Self-pay

## 2022-03-25 MED ORDER — ARMODAFINIL 150 MG PO TABS
ORAL_TABLET | ORAL | 0 refills | Status: DC
  Filled 2022-03-25: qty 30, 30d supply, fill #0

## 2022-03-25 MED ORDER — METFORMIN HCL 500 MG PO TABS
ORAL_TABLET | ORAL | 3 refills | Status: DC
  Filled 2022-03-25: qty 90, 90d supply, fill #0
  Filled 2022-08-12: qty 90, 90d supply, fill #1
  Filled 2023-01-13: qty 90, 90d supply, fill #2

## 2022-04-22 ENCOUNTER — Other Ambulatory Visit (HOSPITAL_COMMUNITY): Payer: Self-pay

## 2022-04-22 MED ORDER — ARMODAFINIL 150 MG PO TABS
ORAL_TABLET | ORAL | 1 refills | Status: DC
  Filled 2022-04-22: qty 60, 60d supply, fill #0
  Filled 2022-04-23: qty 30, 30d supply, fill #0
  Filled 2022-08-12: qty 90, 90d supply, fill #1

## 2022-04-23 ENCOUNTER — Other Ambulatory Visit (HOSPITAL_COMMUNITY): Payer: Self-pay

## 2022-04-27 ENCOUNTER — Other Ambulatory Visit (HOSPITAL_COMMUNITY): Payer: Self-pay

## 2022-08-06 ENCOUNTER — Other Ambulatory Visit: Payer: Self-pay | Admitting: Internal Medicine

## 2022-08-06 DIAGNOSIS — Z1231 Encounter for screening mammogram for malignant neoplasm of breast: Secondary | ICD-10-CM

## 2022-08-12 ENCOUNTER — Other Ambulatory Visit (HOSPITAL_COMMUNITY): Payer: Self-pay

## 2022-08-13 ENCOUNTER — Other Ambulatory Visit (HOSPITAL_COMMUNITY): Payer: Self-pay

## 2022-08-20 ENCOUNTER — Ambulatory Visit: Payer: No Typology Code available for payment source

## 2022-09-17 ENCOUNTER — Ambulatory Visit: Payer: No Typology Code available for payment source

## 2022-10-19 ENCOUNTER — Ambulatory Visit
Admission: RE | Admit: 2022-10-19 | Discharge: 2022-10-19 | Disposition: A | Discharge status: Home / Self Care | Payer: No Typology Code available for payment source | Source: Ambulatory Visit | Attending: Internal Medicine | Admitting: Internal Medicine

## 2022-10-19 DIAGNOSIS — Z1231 Encounter for screening mammogram for malignant neoplasm of breast: Secondary | ICD-10-CM

## 2022-12-02 DIAGNOSIS — G4726 Circadian rhythm sleep disorder, shift work type: Secondary | ICD-10-CM | POA: Diagnosis not present

## 2022-12-02 DIAGNOSIS — R5383 Other fatigue: Secondary | ICD-10-CM | POA: Diagnosis not present

## 2022-12-02 DIAGNOSIS — M542 Cervicalgia: Secondary | ICD-10-CM | POA: Diagnosis not present

## 2022-12-17 DIAGNOSIS — M542 Cervicalgia: Secondary | ICD-10-CM | POA: Diagnosis not present

## 2022-12-17 DIAGNOSIS — M25511 Pain in right shoulder: Secondary | ICD-10-CM | POA: Diagnosis not present

## 2022-12-17 DIAGNOSIS — M25531 Pain in right wrist: Secondary | ICD-10-CM | POA: Diagnosis not present

## 2022-12-28 DIAGNOSIS — R5383 Other fatigue: Secondary | ICD-10-CM | POA: Diagnosis not present

## 2023-01-04 ENCOUNTER — Ambulatory Visit: Payer: 59 | Attending: Orthopaedic Surgery | Admitting: Physical Therapy

## 2023-01-04 ENCOUNTER — Other Ambulatory Visit: Payer: Self-pay

## 2023-01-04 ENCOUNTER — Encounter: Payer: Self-pay | Admitting: Physical Therapy

## 2023-01-04 DIAGNOSIS — R293 Abnormal posture: Secondary | ICD-10-CM | POA: Diagnosis not present

## 2023-01-04 DIAGNOSIS — M6281 Muscle weakness (generalized): Secondary | ICD-10-CM | POA: Diagnosis not present

## 2023-01-04 DIAGNOSIS — M542 Cervicalgia: Secondary | ICD-10-CM | POA: Insufficient documentation

## 2023-01-04 DIAGNOSIS — M25511 Pain in right shoulder: Secondary | ICD-10-CM | POA: Diagnosis not present

## 2023-01-04 NOTE — Patient Instructions (Addendum)
Trigger Point Dry Needling  What is Trigger Point Dry Needling (DN)? DN is a physical therapy technique used to treat muscle pain and dysfunction. Specifically, DN helps deactivate muscle trigger points (muscle knots).  A thin filiform needle is used to penetrate the skin and stimulate the underlying trigger point. The goal is for a local twitch response (LTR) to occur and for the trigger point to relax. No medication of any kind is injected during the procedure.   What Does Trigger Point Dry Needling Feel Like?  The procedure feels different for each individual patient. Some patients report that they do not actually feel the needle enter the skin and overall the process is not painful. Very mild bleeding may occur. However, many patients feel a deep cramping in the muscle in which the needle was inserted. This is the local twitch response.   How Will I feel after the treatment? Soreness is normal, and the onset of soreness may not occur for a few hours. Typically this soreness does not last longer than two days.  Bruising is uncommon, however; ice can be used to decrease any possible bruising.  In rare cases feeling tired or nauseous after the treatment is normal. In addition, your symptoms may get worse before they get better, this period will typically not last longer than 24 hours.   What Can I do After My Treatment? Increase your hydration by drinking more water for the next 24 hours. You may place ice or heat on the areas treated that have become sore, however, do not use heat on inflamed or bruised areas. Heat often brings more relief post needling. You can continue your regular activities, but vigorous activity is not recommended initially after the treatment for 24 hours. DN is best combined with other physical therapy such as strengthening, stretching, and other therapies.    Access Code: [email or web address] URL: [email or web address] Date: 01/04/2023 Prepared by: Voncille Lo  Exercises - Supine Deep Neck Flexor Training  - 2-3 x daily - 7 x weekly - 10 reps - 3 hold - Seated Cervical Retraction Protraction AROM  - 2-3 x daily - 7 x weekly - 1 sets - 10 reps - Seated Gentle Upper Trapezius Stretch  - 1 x daily - 7 x weekly - 1 sets - 3 reps - 30 hold - Gentle Levator Scapulae Stretch  - 1 x daily - 7 x weekly - 3 sets - 3 reps - 30 hold   Voncille Lo, PT, [facility] Certified Exercise Expert for the Aging Adult  01/04/23 9:00 AM Phone: [phone] Fax: [phone]

## 2023-01-04 NOTE — Therapy (Signed)
OUTPATIENT PHYSICAL THERAPY SHOULDER EVALUATION   Patient Name: Kathleen Doyle MRN: [id number] DOB:02-16-1976, 47 y.o., female Today's Date: 01/04/2023  END OF SESSION:  PT End of Session - 01/04/23 0902     Visit Number 1    Number of Visits 13    Date for PT Re-Evaluation 02/18/23    Authorization Type [place] [employer]    PT Start Time [phone]    PT Stop Time 0930    PT Time Calculation (min) 44 min    Activity Tolerance Patient tolerated treatment well    Behavior During Therapy [facility] for tasks assessed/performed             Past Medical History:  Diagnosis Date   Allergy    Anemia    Asthma    as a child, no inhaler no problems as an adult    Dengue fever    hospitalized 2 weeks   Multiple gastric ulcers    Past Surgical History:  Procedure Laterality Date   RHINOPLASTY     Patient Active Problem List   Diagnosis Date Noted   Personal history of colonic polyps 04/10/2021   Elevated blood-pressure reading without diagnosis of hypertension 03/12/2020   Mild intermittent asthma 03/12/2020   History of peptic ulcer 03/12/2020    PCP: Michael Boston, MD   REFERRING PROVIDER:  Hiram Gash, MD   REFERRING DIAG: M77.8 (ICD-10-CM) - Right shoulder tendonitis   THERAPY DIAG:  Cervicalgia - Plan: PT plan of care cert/re-cert  Right shoulder pain, unspecified chronicity - Plan: PT plan of care cert/re-cert  Muscle weakness (generalized) - Plan: PT plan of care cert/re-cert  Abnormal posture - Plan: PT plan of care cert/re-cert  Rationale for Evaluation and Treatment: Rehabilitation  ONSET DATE: 12/23  SUBJECTIVE:                                                                                                                                                                                      SUBJECTIVE STATEMENT: I try not to take oral medication and using natural topical medications for pain from the neck to my arms down to my hands and pain  into my first finger and thumb. I wear a brace sometimes due to my pain in my RT wrist OTC. I have had pain for about 2 months and I feel the pain affects my weakness in my RT hand.  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   PERTINENT HISTORY: Prediabetc, hx of peptic ulcer, elevated blood pressure reading without HTN, mild asthma  PAIN:  Are you having pain? Yes: NPRS scale: at rest 7/10  and  8/10 at work /10 Pain location: RT upper neck and around shld and RT radial wrist Pain description: more aching and spasm Aggravating factors: when ever I move or lift patients Relieving factors: wearing wrist brace and using biofreeze and salonpas, some ibuprofen Aggravates if I sleep on RT side,  lifting above my head hurts and causes spasms PRECAUTIONS: None  WEIGHT BEARING RESTRICTIONS: No  FALLS:  Has patient fallen in last 6 months? No  LIVING ENVIRONMENT: Lives with: lives with their family and lives with their spouse Lives in: House/apartment Stairs: Yes: Internal: 12 steps; can reach both and External: 7 steps; can reach both Has following equipment at home: None  OCCUPATION: ICU RN  PLOF: Independent  PATIENT GOALS:Relieve pain  and ability to lift and do job  NEXT MD VISIT:   OBJECTIVE:   DIAGNOSTIC FINDINGS:  X ray taken but results not available Pt reports swollen but no fx or torn soft tissue  PATIENT SURVEYS:  FOTO 38% predicted 63%  COGNITION: Overall cognitive status: Within functional limits for tasks assessed     SENSATION: WFL  POSTURE: Slight forward head and shoulders  UPPER EXTREMITY ROM:   CERVICAL ROM:   Active ROM A/PROM (deg) eval  Flexion 45  Extension 40  Right lateral flexion 21 pain  Left lateral flexion 25  Right rotation 65  Left rotation 60 pain   (Blank rows = not tested)   Active ROM Right eval Left eval  Shoulder flexion 139P 152  Shoulder extension    Shoulder abduction 121P 140  Shoulder adduction    Shoulder internal  rotation    Shoulder external rotation    Elbow flexion    Elbow extension 0 -10 from extension. Childhood fx  Wrist flexion    Wrist extension    Wrist ulnar deviation WFL WFL  Wrist radial deviation WFL but [painful WFL  Wrist pronation    Wrist supination    (Blank rows = not tested)  UPPER EXTREMITY MMT:  MMT Right eval Left eval  Shoulder flexion 4- pain 4+  Shoulder extension    Shoulder abduction 4- pain 4+  Shoulder adduction    Shoulder internal rotation 4- pain 4+  Shoulder external rotation 4- pain 4+  Middle trapezius    Lower trapezius    Elbow flexion    Elbow extension    Wrist flexion    Wrist extension    Wrist ulnar deviation    Wrist radial deviation    Wrist pronation    Wrist supination    Grip strength (lbs) 48.3 dominant 45.3lb  (Blank rows = not tested)     PALPATION:  Marked TTP over RT infraspinatus over biceps insertion over corocoid process and RT radial wrist,  Pt with RT upper trap spasm.   TODAY'S TREATMENT:  DATE: EVAL 01-04-23   PATIENT EDUCATION: Education details: POC Explanation of findings, issued HEP and TPDN education Person educated: Patient Education method: Explanation, Demonstration, Tactile cues, Verbal cues, and Handouts Education comprehension: verbalized understanding, returned demonstration, and needs further education  HOME EXERCISE PROGRAM: Access Code: [email or web address] URL: [email or web address] Date: 01/04/2023 Prepared by: Voncille Lo  Exercises - Supine Deep Neck Flexor Training  - 2-3 x daily - 7 x weekly - 10 reps - 3 hold - Seated Cervical Retraction Protraction AROM  - 2-3 x daily - 7 x weekly - 1 sets - 10 reps - Seated Gentle Upper Trapezius Stretch  - 1 x daily - 7 x weekly - 1 sets - 3 reps - 30 hold - Gentle Levator Scapulae Stretch  - 1 x daily - 7 x  weekly - 3 sets - 3 reps - 30 hold Trigger Point Dry Needling Treatment: Pre-treatment instruction: Patient instructed on dry needling rationale, procedures, and possible side effects including pain during treatment (achy,cramping feeling), bruising, drop of blood, lightheadedness, nausea, sweating. Patient Consent Given: Yes Education handout provided: Yes Muscles treated: RT upper trap/levator and infraspinatus  Needle size and number: .30x17m x 4 Electrical stimulation performed: No Parameters: N/A Treatment response/outcome: Twitch response elicited and Palpable decrease in muscle tension Post-treatment instructions: Patient instructed to expect possible mild to moderate muscle soreness later today and/or tomorrow. Patient instructed in methods to reduce muscle soreness and to continue prescribed HEP. If patient was dry needled over the lung field, patient was instructed on signs and symptoms of pneumothorax and, however unlikely, to see immediate medical attention should they occur. Patient was also educated on signs and symptoms of infection and to seek medical attention should they occur. Patient verbalized understanding of these instructions and education.   ASSESSMENT:  CLINICAL IMPRESSION: Patient is a 47y.o. female ICU RN who was seen today for physical therapy evaluation and treatment for RT shld tendonitis, cervicalgia(RT upper trap spasm and RT Dequarvain pain. . Pt does present with trigger point pain at infraspinatus RT.  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  Pt will benefit from skilled PT to address pt impairments and pain  and to increase strength in order to return to fully functioning as RN with minimal to no pain with exertion  OBJECTIVE IMPAIRMENTS: decreased activity tolerance, decreased ROM, decreased strength, impaired UE functional use, improper body mechanics, postural dysfunction, and pain.   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   REHAB POTENTIAL: Good  CLINICAL DECISION MAKING: Evolving/moderate complexity  EVALUATION COMPLEXITY: Moderate   GOALS: Goals reviewed with patient? Yes  SHORT TERM GOALS: Target date: 01-25-23  Pt will be independent with intial HEP Baseline:no knowledge Goal status: INITIAL  2.  Pt will be 50% more aware of posture throughout day  Baseline: Pt with gaurded movement and flexed torso and neck on eval Goal status: INITIAL  3.  Pt will be able to decreased brace wear of RT wrist due to decreased pain by 25 % Baseline: Pain Goal status: INITIAL  4.  Demonstrate and verbalize techniques to reduce the risk of re-injury including: lifting, posture, body mechanics.  Baseline: needs reinforcement for job as nurse Goal status: INITIAL    LONG  TERM GOALS: Target date: 02-18-23  Pt will be independent with advanced HEP.  Baseline: limited knowledge, no consistent exercise or self care Goal status: INITIAL  2.  Pt will be able to lift 20 pounds overhead with pain 3/10 or less in order to do job duties as Therapist, sports Baseline: unable to lift without pain presently at eval Goal status: INITIAL  3.  Pt will be able to simulate moving patients in ICU bed with good body mechanics and 3/10 or less pain Baseline: 8/10 Goal status: INITIAL  4.  Pt will be able to sleep on RT side without awaking due to pain in RT shld Baseline: unable to sleep on RT side Goal status: INITIAL  5.  Pt will incorporate a walking  for steps 7000 to 10000 steps to show increased movement and mobility for exercise at least 3-4 days a week Baseline: presently not exercising formally Goal status: INITIAL  6.  FOTO will improve from  38%  to  63%    indicating improved functional mobility.  Baseline: 38% Goal status: INITIAL  PLAN:  PT FREQUENCY: 1-2x/week  PT DURATION: 6 weeks  PLANNED INTERVENTIONS: Therapeutic exercises, Therapeutic activity, Neuromuscular re-education, Balance training, Gait training, Patient/Family education, Self Care, Joint mobilization, Dry Needling, Electrical stimulation, Spinal mobilization, Cryotherapy, Moist heat, Taping, Traction, Ionotophoresis '4mg'$ /ml Dexamethasone, Manual therapy, and Re-evaluation  PLAN FOR NEXT SESSION: Check TPDN  , Work on Wrist HEP/shld  Voncille Lo, PT, [place] Certified Exercise Expert for the Aging Adult  01/04/23 3:28 PM Phone: [phone] Fax: [phone]

## 2023-01-13 ENCOUNTER — Encounter: Payer: Self-pay | Admitting: Nurse Practitioner

## 2023-01-13 ENCOUNTER — Other Ambulatory Visit (HOSPITAL_COMMUNITY): Payer: Self-pay

## 2023-01-13 ENCOUNTER — Ambulatory Visit (INDEPENDENT_AMBULATORY_CARE_PROVIDER_SITE_OTHER): Payer: 59 | Admitting: Nurse Practitioner

## 2023-01-13 VITALS — BP 132/88 | HR 100 | Ht 60.0 in | Wt 135.0 lb

## 2023-01-13 DIAGNOSIS — Z01419 Encounter for gynecological examination (general) (routine) without abnormal findings: Secondary | ICD-10-CM | POA: Diagnosis not present

## 2023-01-13 NOTE — Progress Notes (Signed)
   Kathleen Doyle 05/14/1976 [id number]   History:  47 y.o. G0 presents as new patient to establish care. Monthly cycles. Normal pap history. H/O pre-diabetes, constipation.   Gynecologic History Patient's last menstrual period was 01/03/2023 (exact date). Period Cycle (Days):  (28-30) Period Duration (Days): 4-5 Menstrual Flow: Moderate Menstrual Control: Maxi pad Dysmenorrhea: (!) Moderate Dysmenorrhea Symptoms: Cramping, Headache Contraception/Family planning: abstinence Sexually active: No  Health Maintenance Last Pap: 03/05/2020. Results were: Normal neg HPV Last mammogram: 10/19/2022. Results were: Normal Last colonoscopy: 04/04/2020. Results were: 3-year recall Last Dexa: Not indicated  Past medical history, past surgical history, family history and social history were all reviewed and documented in the EPIC chart. 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  Exam:  Vitals:   01/13/23 1112  BP: 132/88  Pulse: 100  SpO2: 98%  Weight: 135 lb (61.2 kg)  Height: 5' (1.524 m)   Body mass index is 26.37 kg/m.  General appearance:  Normal Thyroid:  Symmetrical, normal in size, without palpable masses or nodularity. Respiratory  Auscultation:  Clear without wheezing or rhonchi Cardiovascular  Auscultation:  Regular rate, without rubs, murmurs or gallops  Edema/varicosities:  Not grossly evident Abdominal  Soft,nontender, without masses, guarding or rebound.  Liver/spleen:  No organomegaly noted  Hernia:  None appreciated  Skin  Inspection:  Grossly normal Breasts: Examined lying and sitting.   Right: Without masses, retractions, nipple discharge or axillary adenopathy.   Left: Without masses, retractions, nipple discharge or axillary adenopathy. Genitourinary   Inguinal/mons:  Normal without inguinal adenopathy  External genitalia:  Normal appearing vulva with no masses, tenderness, or  lesions  BUS/Urethra/Skene's glands:  Normal  Vagina:  Normal appearing with normal color and discharge, no lesions  Cervix:  Normal appearing without discharge or lesions  Uterus:  Normal in size, shape and contour.  Midline and mobile, nontender  Adnexa/parametria:     Rt: Normal in size, without masses or tenderness.   Lt: Normal in size, without masses or tenderness.  Anus and perineum: Normal  Digital rectal exam: Deferred  Patient informed chaperone available to be present for breast and pelvic exam. Patient has requested no chaperone to be present. Patient has been advised what will be completed during breast and pelvic exam.   Assessment/Plan:  47 y.o. G0 to establish care.   Well female exam with routine gynecological exam - Education provided on SBEs, importance of preventative screenings, current guidelines, high calcium diet, regular exercise, and multivitamin daily. Labs with PCP.   Screening for cervical cancer - Normal Pap history.  Will repeat at 5-year interval per guidelines.  Screening for breast cancer - Normal mammogram history.  Continue annual screenings.  Normal breast exam today.  Screening for colon cancer - 2021 colonoscopy. 3-year recall, due this year.   Screening for osteoporosis - Average risk. Will plan DXA at age 37.   Return in 1 year for annual.     Tamela Gammon DNP, 11:25 AM 01/13/2023

## 2023-01-18 ENCOUNTER — Encounter: Payer: Self-pay | Admitting: Physical Therapy

## 2023-01-18 ENCOUNTER — Ambulatory Visit: Payer: 59 | Admitting: Physical Therapy

## 2023-01-18 DIAGNOSIS — M6281 Muscle weakness (generalized): Secondary | ICD-10-CM

## 2023-01-18 DIAGNOSIS — R293 Abnormal posture: Secondary | ICD-10-CM | POA: Diagnosis not present

## 2023-01-18 DIAGNOSIS — M542 Cervicalgia: Secondary | ICD-10-CM | POA: Diagnosis not present

## 2023-01-18 DIAGNOSIS — M25511 Pain in right shoulder: Secondary | ICD-10-CM

## 2023-01-18 NOTE — Therapy (Addendum)
OUTPATIENT PHYSICAL THERAPY TREATMENT NOTE/Discharge Note PHYSICAL THERAPY DISCHARGE SUMMARY  Visits from Start of Care: 2  Current functional level related to goals / functional outcomes: unknown   Remaining deficits: Unknown  did receive injection post PT RX   Education / Equipment: Iniital HEP   Patient agrees to discharge. Patient goals were not met. Patient is being discharged due to not returning since the last visit.  Unable to reassess goals due to not returning to clinic  Patient Name: Kathleen Doyle MRN: [id number] DOB:29-Mar-1976, 47 y.o., female Today's Date: 01/18/2023  PCP: Melida Quitter, MD   REFERRING PROVIDER: Bjorn Pippin, MD    END OF SESSION:   PT End of Session - 01/18/23 0804     Visit Number 2    Number of Visits 13    Date for PT Re-Evaluation 02/18/23    Authorization Type [place] [employer]    PT Start Time 0802    PT Stop Time 0845    PT Time Calculation (min) 43 min    Activity Tolerance Patient tolerated treatment well    Behavior During Therapy [facility] for tasks assessed/performed             Past Medical History:  Diagnosis Date   Allergy    Anemia    Asthma    as a child, no inhaler no problems as an adult    Dengue fever    hospitalized 2 weeks   Multiple gastric ulcers    Past Surgical History:  Procedure Laterality Date   RHINOPLASTY     Patient Active Problem List   Diagnosis Date Noted   Personal history of colonic polyps 04/10/2021   Elevated blood-pressure reading without diagnosis of hypertension 03/12/2020   Mild intermittent asthma 03/12/2020   History of peptic ulcer 03/12/2020    REFERRING DIAG: Right shoulder pain, unspecified chronicity - Plan: PT plan of care cert/re-cert   Muscle weakness (generalized) - Plan: PT plan of care cert/re-cert   Abnormal posture - Plan: PT plan of care cert/re-cert  THERAPY DIAG:  Cervicalgia  Right shoulder pain, unspecified chronicity  Muscle weakness  (generalized)  Abnormal posture  Rationale for Evaluation and Treatment Rehabilitation  PERTINENT HISTORY: Prediabetc, hx of peptic ulcer, elevated blood pressure reading without HTN, mild asthma   PRECAUTIONS: none  SUBJECTIVE:                                                                                                                                                                                      SUBJECTIVE STATEMENT:  I had the injection on 01-13-23 in my RT shld but my neck still  hurts   1-2/10   PAIN:  Are you having pain? 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 Relieving factors: wearing wrist brace and using biofreeze and salonpas, some ibuprofen Aggravates if I sleep on RT side,  lifting above my head hurts and causes spasms   OBJECTIVE: (objective measures completed at initial evaluation unless otherwise dated)    DIAGNOSTIC FINDINGS:  X ray taken but results not available Pt reports swollen but no fx or torn soft tissue   PATIENT SURVEYS:  FOTO 38% predicted 63%   COGNITION: Overall cognitive status: Within functional limits for tasks assessed                                  SENSATION: WFL   POSTURE: Slight forward head and shoulders   UPPER EXTREMITY ROM:    CERVICAL ROM:    Active ROM A/PROM (deg) eval  Flexion 45  Extension 40  Right lateral flexion 21 pain  Left lateral flexion 25  Right rotation 65  Left rotation 60 pain   (Blank rows = not tested)    Active ROM Right eval Left eval  Shoulder flexion 139P 152  Shoulder extension      Shoulder abduction 121P 140  Shoulder adduction      Shoulder internal rotation      Shoulder external rotation      Elbow flexion      Elbow extension 0 -10 from extension. Childhood fx  Wrist flexion      Wrist extension       Wrist ulnar deviation WFL WFL  Wrist radial deviation WFL but [painful WFL  Wrist pronation      Wrist supination      (Blank rows = not tested)   UPPER EXTREMITY MMT:   MMT Right eval Left eval  Shoulder flexion 4- pain 4+  Shoulder extension      Shoulder abduction 4- pain 4+  Shoulder adduction      Shoulder internal rotation 4- pain 4+  Shoulder external rotation 4- pain 4+  Middle trapezius      Lower trapezius      Elbow flexion      Elbow extension      Wrist flexion      Wrist extension      Wrist ulnar deviation      Wrist radial deviation      Wrist pronation      Wrist supination      Grip strength (lbs) 48.3 dominant 45.3lb  (Blank rows = not tested)         PALPATION:  Marked TTP over RT infraspinatus over biceps insertion over corocoid process and RT radial wrist,  Pt with RT upper trap spasm.             TODAY'S TREATMENT:    [facility] Adult PT Treatment:                                                DATE: 01-18-23 Therapeutic Exercise: Supine Deep Neck Flexor Training 10 reps - 10 hold Seated Cervical Retraction Protraction AROM 1  sets - 10 reps Seated Gentle Upper Trapezius Stretch 3 reps - 30 hold Rt only - Gentle Levator Scapulae Stretch  3 reps - 30 hold RT only Shoulder BIL ER with GTB 3x 10 reps Star pattern with GTB BIL  3 x 10 Shoulder extension with resistance - Neutral 10 reps Manual Therapy:    STW - suboccipital release.   UPA RT C- 2 to C-6.  Thoracic PA  mobs for ext  STW for RT upper trap, levator and cervical paraspinals BIL Trigger Point Dry Needling Treatment: Pre-treatment instruction: Patient instructed on dry needling rationale, procedures, and possible side effects including pain during treatment (achy,cramping feeling), bruising, drop of blood, lightheadedness, nausea, sweating. Patient Consent Given: Yes Education handout provided: Yes Muscles treated: RT upper trap/levator and infraspinatus  Needle size and number: .30x27mm  x 4 Electrical stimulation performed: No Parameters: N/A Treatment response/outcome: Twitch response elicited and Palpable decrease in muscle tension Post-treatment instructions: Patient instructed to expect possible mild to moderate muscle soreness later today and/or tomorrow. Patient instructed in methods to reduce muscle soreness and to continue prescribed HEP. If patient was dry needled over the lung field, patient was instructed on signs and symptoms of pneumothorax and, however unlikely, to see immediate medical attention should they occur. Patient was also educated on signs and symptoms of infection and to seek medical attention should they occur. Patient verbalized understanding of these instructions and education.  SELF CARE   explained sleeping and pillow position to prevent compression of neck and shld                                                                                                                                  DATE: EVAL 01-04-23     PATIENT EDUCATION: Education details: POC Explanation of findings, issued HEP and TPDN education Person educated: Patient Education method: Explanation, Demonstration, Tactile cues, Verbal cues, and Handouts Education comprehension: verbalized understanding, returned demonstration, and needs further education   HOME EXERCISE PROGRAM: Access Code: [email or web address] URL: [email or web address] Date: 01/18/2023 Prepared by: Garen Lah  Exercises - Supine Deep Neck Flexor Training  - 2-3 x daily - 7 x weekly - 10 reps - 10 hold - Seated Cervical Retraction Protraction AROM  - 2-3 x daily - 7 x weekly - 1 sets - 10 reps - Seated Gentle Upper Trapezius Stretch  - 1 x daily - 7 x weekly - 1 sets - 3 reps - 30 hold - Gentle Levator Scapulae Stretch  - 1 x daily - 7 x weekly - 3 sets - 3 reps - 30 hold - Shoulder External Rotation and Scapular Retraction with Resistance  - 2 x daily - 7 x weekly - 3 sets - 10 reps - Standing  Shoulder Diagonal Horizontal Abduction 60/120 Degrees with Resistance  - 1 x daily - 7 x weekly - 3 sets - 10 reps - Standing Shoulder Horizontal Abduction with Resistance  -  1 x daily - 7 x weekly - 3 sets - 10 reps - Shoulder extension with resistance - Neutral  - 1 x daily - 7 x weekly - 3 sets - 10 reps    EVAL  Trigger Point Dry Needling Treatment: Pre-treatment instruction: Patient instructed on dry needling rationale, procedures, and possible side effects including pain during treatment (achy,cramping feeling), bruising, drop of blood, lightheadedness, nausea, sweating. Patient Consent Given: Yes Education handout provided: Yes Muscles treated: RT upper trap/levator and infraspinatus  C2 to C^ RT cervical paraspinals and RT SCM and Scalenes  Needle size and number: .30x4mm x 4 Electrical stimulation performed: No Parameters: N/A Treatment response/outcome: Twitch response elicited and Palpable decrease in muscle tension Post-treatment instructions: Patient instructed to expect possible mild to moderate muscle soreness later today and/or tomorrow. Patient instructed in methods to reduce muscle soreness and to continue prescribed HEP. If patient was dry needled over the lung field, patient was instructed on signs and symptoms of pneumothorax and, however unlikely, to see immediate medical attention should they occur. Patient was also educated on signs and symptoms of infection and to seek medical attention should they occur. Patient verbalized understanding of these instructions and education.    ASSESSMENT:   CLINICAL IMPRESSION: Ms Grassel returns for 2nd visit after RT shoulder injection but complaint of neck pain. Pt consents to TPDN.  Pt was closely monitored and no adverse effects. Pt also updated HEP for home use with GTB and was education on pillow and sleep positions to reduce pain. Will continue to progress toward completion of goals. All questions answered and pt leaves in no  acute distress or pain.  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 . Pt does present with trigger point pain at infraspinatus RT.  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  Pt will benefit from skilled PT to address pt impairments and pain  and to increase strength in order to return to fully functioning as RN with minimal to no pain with exertion   OBJECTIVE IMPAIRMENTS: decreased activity tolerance, decreased ROM, decreased strength, impaired UE functional use, improper body mechanics, postural dysfunction, and pain.    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    REHAB POTENTIAL: Good   CLINICAL DECISION MAKING: Evolving/moderate complexity   EVALUATION COMPLEXITY: Moderate     GOALS: Goals reviewed with patient? Yes   SHORT TERM GOALS: Target date: 01-25-23   Pt will be independent with intial HEP Baseline:no knowledge Goal status: INITIAL   2.  Pt will be 50% more aware of posture throughout day  Baseline: Pt with gaurded movement and flexed torso and neck on eval Goal status: INITIAL   3.  Pt will be able to decreased brace wear of RT wrist due to decreased pain by 25 % Baseline: Pain Goal status: INITIAL   4.  Demonstrate and verbalize techniques to reduce the risk of re-injury including: lifting, posture, body mechanics.  Baseline: needs reinforcement for  job as Engineer, civil (consulting) Goal status: INITIAL       LONG TERM GOALS: Target date: 02-18-23   Pt will be  independent with advanced HEP.  Baseline: limited knowledge, no consistent exercise or self care Goal status: INITIAL   2.  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  Pt will be able to simulate moving patients in ICU bed with good body mechanics and 3/10 or less pain Baseline: 8/10 Goal status: INITIAL   4.  Pt will be able to sleep on RT side without awaking due to pain in RT shld Baseline: unable to sleep on RT side Goal status: INITIAL   5.  Pt will incorporate a walking  for steps 7000 to 10000 steps to show increased movement and mobility for exercise at least 3-4 days a week Baseline: presently not exercising formally Goal status: INITIAL   6.  FOTO will improve from  38%  to  63%   indicating improved functional mobility.  Baseline: 38% Goal status: INITIAL   PLAN:   PT FREQUENCY: 1-2x/week   PT DURATION: 6 weeks   PLANNED INTERVENTIONS: Therapeutic exercises, Therapeutic activity, Neuromuscular re-education, Balance training, Gait training, Patient/Family education, Self Care, Joint mobilization, Dry Needling, Electrical stimulation, Spinal mobilization, Cryotherapy, Moist heat, Taping, Traction, Ionotophoresis /ml Dexamethasone, Manual therapy, and Re-evaluation   PLAN FOR NEXT SESSION: Check TPDN  , Work on Wrist HEP/shld    Garen Lah, PT, ATRIC Certified Exercise Expert for the Aging Adult  01/18/23 8:51 AM Phone: [phone] Fax: [phone]       Garen Lah, PT, ATRIC Certified Exercise Expert for the Aging Adult  03/22/23 4:42 PM Phone: [phone] Fax: [phone]

## 2023-03-03 ENCOUNTER — Ambulatory Visit: Payer: 59 | Admitting: Physical Therapy

## 2023-03-08 ENCOUNTER — Telehealth: Payer: Self-pay | Admitting: Physical Therapy

## 2023-03-08 ENCOUNTER — Ambulatory Visit: Payer: 59 | Attending: Orthopaedic Surgery | Admitting: Physical Therapy

## 2023-03-08 NOTE — Telephone Encounter (Signed)
LVM for pt.  Reminding of missed appt today and that she has one remaining appt on the schedule for 8:00 for Thursday April 11.  Pt was asked to call to cancel if she no longer needs PT since she has not attended since last appt on 01-18-23.  Garen Lah, PT, ATRIC Certified Exercise Expert for the Aging Adult  03/08/23 8:31 AM Phone: [phone] Fax: [phone]

## 2023-03-10 ENCOUNTER — Ambulatory Visit: Payer: 59 | Admitting: Physical Therapy

## 2023-03-30 DIAGNOSIS — R030 Elevated blood-pressure reading, without diagnosis of hypertension: Secondary | ICD-10-CM | POA: Diagnosis not present

## 2023-03-30 DIAGNOSIS — E785 Hyperlipidemia, unspecified: Secondary | ICD-10-CM | POA: Diagnosis not present

## 2023-03-30 DIAGNOSIS — R7989 Other specified abnormal findings of blood chemistry: Secondary | ICD-10-CM | POA: Diagnosis not present

## 2023-04-06 ENCOUNTER — Other Ambulatory Visit (HOSPITAL_COMMUNITY): Payer: Self-pay

## 2023-04-06 MED ORDER — LINZESS 72 MCG PO CAPS
ORAL_CAPSULE | ORAL | 3 refills | Status: DC
  Filled 2023-04-06: qty 90, 90d supply, fill #0

## 2023-04-06 MED ORDER — ARMODAFINIL 200 MG PO TABS
200.0000 mg | ORAL_TABLET | ORAL | 1 refills | Status: DC
  Filled 2023-04-06: qty 90, 90d supply, fill #0
  Filled 2023-08-25: qty 90, 90d supply, fill #1

## 2023-04-07 ENCOUNTER — Other Ambulatory Visit (HOSPITAL_COMMUNITY): Payer: Self-pay

## 2023-04-11 ENCOUNTER — Encounter: Payer: Self-pay | Admitting: Physician Assistant

## 2023-06-21 NOTE — Progress Notes (Unsigned)
06/22/2023 Kathleen Doyle [id number] 06/16/1976  Referring provider: Melida Quitter, MD Primary GI doctor: Dr. Rhea Belton  ASSESSMENT AND PLAN:   History of adenomatous polyp of colon 10 mm TA polyp 2021, due for recall this year We have discussed the risks of bleeding, infection, perforation, medication reactions, and remote risk of death associated with colonoscopy. All questions were answered and the patient acknowledges these risk and wishes to proceed.  Chronic idiopathic constipation/IBS-C - Increase fiber/ water intake, decrease caffeine, increase activity level. -had diarrhea with linzess, AB pain with miralax - will do trial of trulance, if this does not work can consider motegrity, patient does well with dulcolax   Patient Care Team: Melida Quitter, MD as PCP - General (Internal Medicine) Quintella Reichert, MD as PCP - Cardiology (Cardiology)  HISTORY OF PRESENT ILLNESS: 47 y.o. female with a past medical history of personal history of precancerous polyps and others listed below presents for evaluation of GERD/reflux, onGERD and repeat colon..   04/04/2020 screening colonoscopy with Dr. Rhea Belton showed 10 mm polyp ascending colon which was tubular adenomatous, recall 3 years  She denies GERD.  She has long standing history of constipation, will have BM every 3-4 days.  She has tried miralax, fiber that has not helped. She can take dulocalax at night and have BM in the morning.  She states if she takes the linzess 72 mg every day, she will have diarrhea. 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  She has more bloating but no AB pain.  No history of child birth, no urinary symptoms.   She denies NSAID use.  She denies ETOH use.   She denies family history of gallbladder issues.  She denies tobacco use.  She denies drug use.    She  reports that she has never smoked. She has never used smokeless tobacco. She reports  current alcohol use. She reports that she does not use drugs.  RELEVANT LABS AND IMAGING: CBC    Component Value Date/Time   WBC 7.2 07/22/2021 0758   RBC 4.62 07/22/2021 0758   HGB 13.2 07/22/2021 0758   HCT 39.3 07/22/2021 0758   PLT 282 07/22/2021 0758   MCV 85 07/22/2021 0758   MCH 28.6 07/22/2021 0758   MCHC 33.6 07/22/2021 0758   RDW 13.5 07/22/2021 0758   No results for input(s): "HGB" in the last 8760 hours.  CMP     Component Value Date/Time   NA 139 07/22/2021 0758   K 4.2 07/22/2021 0758   CL 104 07/22/2021 0758   CO2 28 07/22/2021 0758   GLUCOSE 87 07/22/2021 0758   BUN 10 07/22/2021 0758   CREATININE 0.65 07/22/2021 0758   CALCIUM 9.5 07/22/2021 0758       No data to display            Current Medications:   Current Outpatient Medications (Endocrine & Metabolic):    MEDROL 4 MG tablet, Take by mouth.   Current Outpatient Medications (Respiratory):    cetirizine (ZYRTEC ALLERGY) 10 MG tablet, daily as needed for allergies Oral  Current Outpatient Medications (Analgesics):    acetaminophen (TYLENOL) 325 MG tablet, Take 650 mg by mouth every 6 (six) hours as needed.   ibuprofen (ADVIL) 200 MG tablet, Take 400 mg by mouth every 6 (six) hours as needed.   Current Outpatient Medications (Other):    Armodafinil (NUVIGIL) 200  MG TABS, Take 1 tablet (200 mg total) by mouth 1 hour before work shift.   bisacodyl (DULCOLAX) 5 MG EC tablet, as needed Oral   melatonin 5 MG TABS, 1 tablet in the evening Orally Once a day   Plecanatide (TRULANCE) 3 MG TABS, Take 1 tablet (3 mg total) by mouth daily.  Medical History:  Past Medical History:  Diagnosis Date   Allergy    Anemia    Asthma    as a child, no inhaler no problems as an adult    Dengue fever    hospitalized 2 weeks   Multiple gastric ulcers    Allergies:  Allergies  Allergen Reactions   Merthiolate [Thimerosal (Thiomersal)] Rash   Penicillins Rash     Surgical History:  She  has a past  surgical history that includes Rhinoplasty. Family History:  Her family history includes Brain cancer in her paternal grandfather; Breast cancer in her maternal aunt and paternal aunt; Heart disease in her father; Kidney Stones in her mother; Lung cancer in her paternal grandfather.  REVIEW OF SYSTEMS  : All other systems reviewed and negative except where noted in the History of Present Illness.  PHYSICAL EXAM: BP 120/74   Pulse 95   Ht 5\' 1"  (1.549 m)   Wt 133 lb (60.3 kg)   BMI 25.13 kg/m  General Appearance: Well nourished, in no apparent distress. Head:   Normocephalic and atraumatic. Eyes:  sclerae anicteric,conjunctive pink  Respiratory: Respiratory effort normal, BS equal bilaterally without rales, rhonchi, wheezing. Cardio: RRR with no MRGs. Peripheral pulses intact.  Abdomen: Soft,  Flat ,active bowel sounds. No tenderness . Without guarding and Without rebound. No masses. Rectal: Not evaluated Musculoskeletal: Full ROM, Normal gait. Without edema. Skin:  Dry and intact without significant lesions or rashes Neuro: Alert and  oriented x4;  No focal deficits. Psych:  Cooperative. Normal mood and affect.    Kathleen Albee, PA-C 11:02 AM

## 2023-06-22 ENCOUNTER — Ambulatory Visit: Payer: 59 | Admitting: Physician Assistant

## 2023-06-22 ENCOUNTER — Other Ambulatory Visit (HOSPITAL_COMMUNITY): Payer: Self-pay

## 2023-06-22 ENCOUNTER — Encounter: Payer: Self-pay | Admitting: Physician Assistant

## 2023-06-22 VITALS — BP 120/74 | HR 95 | Ht 61.0 in | Wt 133.0 lb

## 2023-06-22 DIAGNOSIS — K5904 Chronic idiopathic constipation: Secondary | ICD-10-CM | POA: Diagnosis not present

## 2023-06-22 DIAGNOSIS — Z86010 Personal history of colonic polyps: Secondary | ICD-10-CM

## 2023-06-22 MED ORDER — NA SULFATE-K SULFATE-MG SULF 17.5-3.13-1.6 GM/177ML PO SOLN
1.0000 | Freq: Once | ORAL | 0 refills | Status: AC
  Filled 2023-06-22 – 2023-08-12 (×3): qty 354, 1d supply, fill #0

## 2023-06-22 MED ORDER — TRULANCE 3 MG PO TABS
3.0000 mg | ORAL_TABLET | Freq: Every day | ORAL | 0 refills | Status: AC
  Filled 2023-06-22: qty 30, 30d supply, fill #0

## 2023-06-22 NOTE — Patient Instructions (Addendum)
You have been scheduled for a colonoscopy. Please follow written instructions given to you at your visit today.   Please pick up your prep supplies at the pharmacy within the next 1-3 days.  If you use inhalers (even only as needed), please bring them with you on the day of your procedure.  DO NOT TAKE 7 DAYS PRIOR TO TEST- Trulicity (dulaglutide) Ozempic, Wegovy (semaglutide) Mounjaro (tirzepatide) Bydureon Bcise (exanatide extended release)  DO NOT TAKE 1 DAY PRIOR TO YOUR TEST Rybelsus (semaglutide) Adlyxin (lixisenatide) Victoza (liraglutide) Byetta (exanatide) ___________________________________________________________________________  Consistency is key.  Try trulance rather than the linzess If this is not help can try motegrity  Recommend starting on a fiber supplement, can try metamucil first but if this causes gas/bloating switch to benefiber or citracel, these do not cause gas.  Take with fiber with with a full 8 oz glass of water once a day. This can take 1 month to start helping, so try for at least one month.  Recommend increasing water and physical activity.   - Drink at least 64-80 ounces of water/liquid per day. - Establish a time to try to move your bowels every day.  For many people, this is after a cup of coffee or after a meal such as breakfast. - Sit all of the way back on the toilet keeping your back fairly straight and while sitting up, try to rest the tops of your forearms on your upper thighs.   - Raising your feet with a step stool/squatty potty can be helpful to improve the angle that allows your stool to pass through the rectum. - Relax the rectum feeling it bulge toward the toilet water.  If you feel your rectum raising toward your body, you are contracting rather than relaxing. - Breathe in and slowly exhale. "Belly breath" by expanding your belly towards your belly button. Keep belly expanded as you gently direct pressure down and back to the anus.  A  low pitched GRRR sound can assist with increasing intra-abdominal pressure.  (Can also trying to blow on a pinwheel and make it move, this helps with the same belly breathing) - Repeat 3-4 times. If unsuccessful, contract the pelvic floor to restore normal tone and get off the toilet.  Avoid excessive straining. - To reduce excessive wiping by teaching your anus to normally contract, place hands on outer aspect of knees and resist knee movement outward.  Hold 5-10 second then place hands just inside of knees and resist inward movement of knees.  Hold 5 seconds.  Repeat a few times each way.  Go to the ER if unable to pass gas, severe AB pain, unable to hold down food, any shortness of breath of chest pain.   Miralax is an osmotic laxative.  It only brings more water into the stool.  This is safe to take daily.  Can take up to 17 gram of miralax twice a day.  Mix with juice or coffee.  Start 1 capful at night for 3-4 days and reassess your response in 3-4 days.  You can increase and decrease the dose based on your response.  Remember, it can take up to 3-4 days to take effect OR for the effects to wear off.   I often pair this with benefiber in the morning to help assure the stool is not too loose.    _______________________________________________________  If your blood pressure at your visit was 140/90 or greater, please contact your primary care physician to follow up  on this.  _______________________________________________________  If you are age 49 or older, your body mass index should be between 23-30. Your Body mass index is 25.13 kg/m. If this is out of the aforementioned range listed, please consider follow up with your Primary Care Provider.  If you are age 49 or younger, your body mass index should be between 19-25. Your Body mass index is 25.13 kg/m. If this is out of the aformentioned range listed, please consider follow up with your Primary Care Provider.    ________________________________________________________  The [place] [facility] providers would like to encourage you to use [facility] to communicate with providers for non-urgent requests or questions.  Due to long hold times on the telephone, sending your provider a message by [facility] may be a faster and more efficient way to get a response.  Please allow 48 business hours for a response.  Please remember that this is for non-urgent requests.  _______________________________________________________ It was a pleasure to see you today!  Thank you for trusting me with your gastrointestinal care!

## 2023-06-23 ENCOUNTER — Other Ambulatory Visit (HOSPITAL_COMMUNITY): Payer: Self-pay

## 2023-06-24 ENCOUNTER — Other Ambulatory Visit (HOSPITAL_COMMUNITY): Payer: Self-pay

## 2023-06-24 NOTE — Progress Notes (Signed)
Addendum: Reviewed and agree with assessment and management plan. Pyrtle, Jay M, MD  

## 2023-07-09 ENCOUNTER — Other Ambulatory Visit (HOSPITAL_COMMUNITY): Payer: Self-pay

## 2023-07-13 ENCOUNTER — Other Ambulatory Visit (HOSPITAL_COMMUNITY): Payer: Self-pay

## 2023-07-25 ENCOUNTER — Other Ambulatory Visit (HOSPITAL_COMMUNITY): Payer: Self-pay

## 2023-08-04 ENCOUNTER — Other Ambulatory Visit (HOSPITAL_COMMUNITY): Payer: Self-pay

## 2023-08-05 ENCOUNTER — Encounter: Payer: Self-pay | Admitting: Internal Medicine

## 2023-08-11 ENCOUNTER — Encounter: Payer: 59 | Admitting: Internal Medicine

## 2023-08-12 ENCOUNTER — Other Ambulatory Visit (HOSPITAL_COMMUNITY): Payer: Self-pay

## 2023-08-13 ENCOUNTER — Encounter: Payer: Self-pay | Admitting: Certified Registered Nurse Anesthetist

## 2023-08-22 ENCOUNTER — Ambulatory Visit (AMBULATORY_SURGERY_CENTER): Payer: 59 | Admitting: Internal Medicine

## 2023-08-22 ENCOUNTER — Encounter: Payer: Self-pay | Admitting: Internal Medicine

## 2023-08-22 ENCOUNTER — Other Ambulatory Visit (HOSPITAL_COMMUNITY): Payer: Self-pay

## 2023-08-22 VITALS — BP 151/87 | HR 83 | Temp 97.1°F | Resp 14 | Ht 61.0 in | Wt 133.0 lb

## 2023-08-22 DIAGNOSIS — Z86010 Personal history of colonic polyps: Secondary | ICD-10-CM | POA: Diagnosis not present

## 2023-08-22 DIAGNOSIS — D120 Benign neoplasm of cecum: Secondary | ICD-10-CM | POA: Diagnosis not present

## 2023-08-22 DIAGNOSIS — Z09 Encounter for follow-up examination after completed treatment for conditions other than malignant neoplasm: Secondary | ICD-10-CM

## 2023-08-22 DIAGNOSIS — Z1211 Encounter for screening for malignant neoplasm of colon: Secondary | ICD-10-CM | POA: Diagnosis not present

## 2023-08-22 MED ORDER — LUBIPROSTONE 24 MCG PO CAPS
24.0000 ug | ORAL_CAPSULE | Freq: Two times a day (BID) | ORAL | 2 refills | Status: DC
  Filled 2023-08-22: qty 60, 30d supply, fill #0

## 2023-08-22 MED ORDER — SODIUM CHLORIDE 0.9 % IV SOLN: 500.0000 mL | INTRAVENOUS | Status: DC

## 2023-08-22 NOTE — Progress Notes (Signed)
Report given to PACU, vss 

## 2023-08-22 NOTE — Op Note (Signed)
[facility] Patient Name: Kathleen Doyle Procedure Date: 08/22/2023 4:15 PM MRN: [id number] Endoscopist: Beverley Fiedler , MD, [id number] Age: 47 Referring MD:  Date of Birth: October 08, 1976 Gender: Female Account #: [id number] Procedure:                Colonoscopy Indications:              High risk colon cancer surveillance: Personal                            history of adenoma (10 mm or greater in size), Last                            colonoscopy: May 2021 (index exam) Medicines:                Monitored Anesthesia Care Procedure:                Pre-Anesthesia Assessment:                           - Prior to the procedure, a History and Physical                            was performed, and patient medications and                            allergies were reviewed. The patient's tolerance of                            previous anesthesia was also reviewed. The risks                            and benefits of the procedure and the sedation                            options and risks were discussed with the patient.                            All questions were answered, and informed consent                            was obtained. Prior Anticoagulants: The patient has                            taken no anticoagulant or antiplatelet agents. ASA                            Grade Assessment: II - A patient with mild systemic                            disease. After reviewing the risks and benefits,                            the patient was deemed in satisfactory condition to  undergo the procedure.                           After obtaining informed consent, the colonoscope                            was passed under direct vision. Throughout the                            procedure, the patient's blood pressure, pulse, and                            oxygen saturations were monitored continuously. The                            Olympus PCF-H190DL  (#[id number]) Colonoscope was                            introduced through the anus and advanced to the                            cecum, identified by appendiceal orifice and                            ileocecal valve. The colonoscopy was performed                            without difficulty. The patient tolerated the                            procedure well. The quality of the bowel                            preparation was good. The ileocecal valve,                            appendiceal orifice, and rectum were photographed. Scope In: 4:22:34 PM Scope Out: 4:36:26 PM Scope Withdrawal Time: 0 hours 11 minutes 9 seconds  Total Procedure Duration: 0 hours 13 minutes 52 seconds  Findings:                 The digital rectal exam was normal.                           A 5 mm polyp was found in the cecum. The polyp was                            sessile. The polyp was removed with a cold snare.                            Resection and retrieval were complete.                           The exam was otherwise without abnormality on  direct and retroflexion views. Complications:            No immediate complications. Estimated Blood Loss:     Estimated blood loss: none. Impression:               - One 5 mm polyp in the cecum, removed with a cold                            snare. Resected and retrieved.                           - The examination was otherwise normal on direct                            and retroflexion views. Recommendation:           - Patient has a contact number available for                            emergencies. The signs and symptoms of potential                            delayed complications were discussed with the                            patient. Return to normal activities tomorrow.                            Written discharge instructions were provided to the                            patient.                           - Resume  previous diet.                           - Continue present medications.                           - Await pathology results.                           - Repeat colonoscopy in 5 years for surveillance. Beverley Fiedler, MD 08/22/2023 4:38:31 PM This report has been signed electronically.

## 2023-08-22 NOTE — Progress Notes (Signed)
Called to room to assist during endoscopic procedure.  Patient ID and intended procedure confirmed with present staff. Received instructions for my participation in the procedure from the performing physician.  

## 2023-08-22 NOTE — Progress Notes (Signed)
GASTROENTEROLOGY PROCEDURE H&P NOTE   Primary Care Physician: Melida Quitter, MD    Reason for Procedure:  History of 1 cm adenoma  Plan:    Surveillance colonoscopy  Patient is appropriate for endoscopic procedure(s) in the ambulatory ([facility]) setting.  The nature of the procedure, as well as the risks, benefits, and alternatives were carefully and thoroughly reviewed with the patient. Ample time for discussion and questions allowed. The patient understood, was satisfied, and agreed to proceed.     HPI: Kathleen Doyle is a 47 y.o. female who presents for colonoscopy.  Medical history as below.  Tolerated the prep.  No recent chest pain or shortness of breath.  No abdominal pain today.  Past Medical History:  Diagnosis Date   Allergy    Anemia    Asthma    as a child, no inhaler no problems as an adult    Blood transfusion without reported diagnosis    Dengue fever    hospitalized 2 weeks   Multiple gastric ulcers     Past Surgical History:  Procedure Laterality Date   COLONOSCOPY     RHINOPLASTY      Prior to Admission medications   Medication Sig Start Date End Date Taking? Authorizing Provider  Armodafinil (NUVIGIL) 200 MG TABS Take 1 tablet (200 mg total) by mouth 1 hour before work shift. 04/06/23  Yes   melatonin 5 MG TABS 1 tablet in the evening Orally Once a day   Yes [provider]  acetaminophen (TYLENOL) 325 MG tablet Take 650 mg by mouth every 6 (six) hours as needed.    [provider]  bisacodyl (DULCOLAX) 5 MG EC tablet as needed Oral 03/12/20   [provider]  cetirizine (ZYRTEC ALLERGY) 10 MG tablet daily as needed for allergies Oral 03/12/20   [provider]  ibuprofen (ADVIL) 200 MG tablet Take 400 mg by mouth every 6 (six) hours as needed.    [provider]    Current Outpatient Medications  Medication Sig Dispense Refill   Armodafinil (NUVIGIL) 200 MG TABS Take 1 tablet (200 mg total) by  mouth 1 hour before work shift. 90 tablet 1   melatonin 5 MG TABS 1 tablet in the evening Orally Once a day     acetaminophen (TYLENOL) 325 MG tablet Take 650 mg by mouth every 6 (six) hours as needed.     bisacodyl (DULCOLAX) 5 MG EC tablet as needed Oral     cetirizine (ZYRTEC ALLERGY) 10 MG tablet daily as needed for allergies Oral     ibuprofen (ADVIL) 200 MG tablet Take 400 mg by mouth every 6 (six) hours as needed.     Current Facility-Administered Medications  Medication Dose Route Frequency Provider Last Rate Last Admin   0.9 %  sodium chloride infusion  500 mL Intravenous Continuous Markeshia Giebel, Carie Caddy, MD        Allergies as of 08/22/2023 - Review Complete 08/22/2023  Allergen Reaction Noted   Merthiolate [thimerosal (thiomersal)] Rash 03/05/2020   Penicillins Rash 03/05/2020    Family History  Problem Relation Age of Onset   Kidney Stones Mother    Heart disease Father    Lung cancer Paternal Grandfather    Brain cancer Paternal Grandfather    Breast cancer Maternal Aunt    Breast cancer Paternal Aunt    Colon cancer Neg Hx    Stomach cancer Neg Hx    Rectal cancer Neg Hx  Social History   Socioeconomic History   Marital status: Divorced    Spouse name: Not on file   Number of children: Not on file   Years of education: Not on file   Highest education level: Not on file  Occupational History   Occupation: Registered Nurse    Employer: [employer]  Tobacco Use   Smoking status: Never   Smokeless tobacco: Never  Vaping Use   Vaping status: Never Used  Substance and Sexual Activity   Alcohol use: Yes    Comment: occasionally   Drug use: Never   Sexual activity: Not Currently    Partners: Male    Birth control/protection: None    Comment: First IC >16 y/o, <5 Partners, No hx of STIs  Other Topics Concern   Not on file  Social History Narrative   Not on file   Social Determinants of Health   Financial Resource Strain: Not on file  Food Insecurity: Not  on file  Transportation Needs: Not on file  Physical Activity: Not on file  Stress: Not on file  Social Connections: Not on file  Intimate Partner Violence: Not on file    Physical Exam: Vital signs in last 24 hours: @BP  127/75   Pulse 91   Temp (!) 97.1 F (36.2 C)   Ht 5\' 1"  (1.549 m)   Wt 133 lb (60.3 kg)   SpO2 100%   BMI 25.13 kg/m  GEN: NAD EYE: Sclerae anicteric ENT: MMM CV: Non-tachycardic Pulm: CTA b/l GI: Soft, NT/ND NEURO:  Alert & Oriented x 3   Erick Blinks, MD [facility]  08/22/2023 4:09 PM

## 2023-08-22 NOTE — Patient Instructions (Addendum)
Please read handouts provided. Continue present medications. Await pathology results. Repeat colonoscopy in 5 years for screening. Amitiza 24 mcg twice daily.  YOU HAD AN ENDOSCOPIC PROCEDURE TODAY AT THE [facility]:   Refer to the procedure report that was given to you for any specific questions about what was found during the examination.  If the procedure report does not answer your questions, please call your gastroenterologist to clarify.  If you requested that your care partner not be given the details of your procedure findings, then the procedure report has been included in a sealed envelope for you to review at your convenience later.  YOU SHOULD EXPECT: Some feelings of bloating in the abdomen. Passage of more gas than usual.  Walking can help get rid of the air that was put into your GI tract during the procedure and reduce the bloating. If you had a lower endoscopy (such as a colonoscopy or flexible sigmoidoscopy) you may notice spotting of blood in your stool or on the toilet paper. If you underwent a bowel prep for your procedure, you may not have a normal bowel movement for a few days.  Please Note:  You might notice some irritation and congestion in your nose or some drainage.  This is from the oxygen used during your procedure.  There is no need for concern and it should clear up in a day or so.  SYMPTOMS TO REPORT IMMEDIATELY:  Following lower endoscopy (colonoscopy or flexible sigmoidoscopy):  Excessive amounts of blood in the stool  Significant tenderness or worsening of abdominal pains  Swelling of the abdomen that is new, acute  Fever of 100F or higher.  For urgent or emergent issues, a gastroenterologist can be reached at any hour by calling [phone]. Do not use MyChart messaging for urgent concerns.    DIET:  We do recommend a small meal at first, but then you may proceed to your regular diet.  Drink plenty of fluids but you should avoid alcoholic  beverages for 24 hours.  ACTIVITY:  You should plan to take it easy for the rest of today and you should NOT DRIVE or use heavy machinery until tomorrow (because of the sedation medicines used during the test).    FOLLOW UP: Our staff will call the number listed on your records the next business day following your procedure.  We will call around 7:15- 8:00 am to check on you and address any questions or concerns that you may have regarding the information given to you following your procedure. If we do not reach you, we will leave a message.     If any biopsies were taken you will be contacted by phone or by letter within the next 1-3 weeks.  Please call us at [phone] if you have not heard about the biopsies in 3 weeks.    SIGNATURES/CONFIDENTIALITY: You and/or your care partner have signed paperwork which will be entered into your electronic medical record.  These signatures attest to the fact that that the information above on your After Visit Summary has been reviewed and is understood.  Full responsibility of the confidentiality of this discharge information lies with you and/or your care-partner.YOU HAD AN ENDOSCOPIC PROCEDURE TODAY AT THE [facility]:   Refer to the procedure report that was given to you for any specific questions about what was found during the examination.  If the procedure report does not answer your questions, please call your gastroenterologist to clarify.  If you  requested that your care partner not be given the details of your procedure findings, then the procedure report has been included in a sealed envelope for you to review at your convenience later.  YOU SHOULD EXPECT: Some feelings of bloating in the abdomen. Passage of more gas than usual.  Walking can help get rid of the air that was put into your GI tract during the procedure and reduce the bloating. If you had a lower endoscopy (such as a colonoscopy or flexible sigmoidoscopy) you may notice  spotting of blood in your stool or on the toilet paper. If you underwent a bowel prep for your procedure, you may not have a normal bowel movement for a few days.  Please Note:  You might notice some irritation and congestion in your nose or some drainage.  This is from the oxygen used during your procedure.  There is no need for concern and it should clear up in a day or so.  SYMPTOMS TO REPORT IMMEDIATELY:  Following lower endoscopy (colonoscopy or flexible sigmoidoscopy):  Excessive amounts of blood in the stool  Significant tenderness or worsening of abdominal pains  Swelling of the abdomen that is new, acute  Fever of 100F or higher   For urgent or emergent issues, a gastroenterologist can be reached at any hour by calling [phone]. Do not use MyChart messaging for urgent concerns.    DIET:  We do recommend a small meal at first, but then you may proceed to your regular diet.  Drink plenty of fluids but you should avoid alcoholic beverages for 24 hours.  ACTIVITY:  You should plan to take it easy for the rest of today and you should NOT DRIVE or use heavy machinery until tomorrow (because of the sedation medicines used during the test).    FOLLOW UP: Our staff will call the number listed on your records the next business day following your procedure.  We will call around 7:15- 8:00 am to check on you and address any questions or concerns that you may have regarding the information given to you following your procedure. If we do not reach you, we will leave a message.     If any biopsies were taken you will be contacted by phone or by letter within the next 1-3 weeks.  Please call us at [phone] if you have not heard about the biopsies in 3 weeks.    SIGNATURES/CONFIDENTIALITY: You and/or your care partner have signed paperwork which will be entered into your electronic medical record.  These signatures attest to the fact that that the information above on your After  Visit Summary has been reviewed and is understood.  Full responsibility of the confidentiality of this discharge information lies with you and/or your care-partner.

## 2023-08-23 ENCOUNTER — Telehealth: Payer: Self-pay | Admitting: *Deleted

## 2023-08-23 ENCOUNTER — Other Ambulatory Visit (HOSPITAL_COMMUNITY): Payer: Self-pay

## 2023-08-23 NOTE — Telephone Encounter (Signed)
  Follow up Call-     08/22/2023    3:16 PM  Call back number  Post procedure Call Back phone  # [phone]  Permission to leave phone message Yes     Patient questions:  Do you have a fever, pain , or abdominal swelling? No. Pain Score  0 *  Have you tolerated food without any problems? Yes.    Have you been able to return to your normal activities? Yes.    Do you have any questions about your discharge instructions: Diet   No. Medications  No. Follow up visit  No.  Do you have questions or concerns about your Care? No.  Actions: * If pain score is 4 or above: No action needed, pain <4.

## 2023-08-25 ENCOUNTER — Other Ambulatory Visit (HOSPITAL_COMMUNITY): Payer: Self-pay

## 2023-08-25 LAB — SURGICAL PATHOLOGY

## 2023-08-26 ENCOUNTER — Encounter: Payer: Self-pay | Admitting: Internal Medicine

## 2023-09-19 ENCOUNTER — Telehealth: Payer: Self-pay | Admitting: Internal Medicine

## 2023-09-19 MED ORDER — TRULANCE 3 MG PO TABS: 3.0000 mg | ORAL_TABLET | Freq: Every day | ORAL | 1 refills | Status: DC

## 2023-09-19 NOTE — Telephone Encounter (Signed)
Per Dr Lauro Franklin last OV note patient could have a trial of Trulance , Sent to patients pharmacy

## 2023-09-19 NOTE — Telephone Encounter (Signed)
Inbound call from patient, would like Trulance sent to pharmacy, states Amitiza is no longer working.

## 2023-09-20 ENCOUNTER — Ambulatory Visit (INDEPENDENT_AMBULATORY_CARE_PROVIDER_SITE_OTHER): Payer: 59 | Admitting: Podiatry

## 2023-09-20 DIAGNOSIS — M205X2 Other deformities of toe(s) (acquired), left foot: Secondary | ICD-10-CM | POA: Diagnosis not present

## 2023-09-20 DIAGNOSIS — L84 Corns and callosities: Secondary | ICD-10-CM

## 2023-09-20 NOTE — Progress Notes (Signed)
   HPI: 47 y.o. female presents today with c/o painful corns between the toes on the left foot, between the 4th and 5th toes.  This has been an ongoing issue for the past year.  She's tried some pads between the toes with no relief.  She also got bigger/wider shoes, with no relief.  She is a Runner, broadcasting/film/video.   Past Medical History:  Diagnosis Date   Allergy    Anemia    Asthma    as a child, no inhaler no problems as an adult    Blood transfusion without reported diagnosis    Dengue fever    hospitalized 2 weeks   Multiple gastric ulcers     Past Surgical History:  Procedure Laterality Date   COLONOSCOPY     RHINOPLASTY      Allergies  Allergen Reactions   Merthiolate [Thimerosal (Thiomersal)] Rash   Penicillins Rash    Physical Exam: General: The patient is alert and oriented x3 in no acute distress.  Dermatology: Skin is warm, dry and supple bilateral lower extremities. Interspaces are clear of maceration and debris.  Hyperkeratotic lesions with pain on palpation on medial left 5th toe DIPJ and lateral left 4th toe PIPJ.    Vascular: Palpable pedal pulses bilaterally. Capillary refill within normal limits.  No appreciable edema.  No erythema or calor.  Neurological: Light touch sensation grossly intact bilateral feet.   Musculoskeletal Exam: Mild adductovarus deformity of 5th toe left foot.  Assessment/Plan of Care: 1. Corns   2. Acquired adductovarus rotation of toe of left foot     Discussed clinical findings with patient today.  The hyperkeratotic lesions on medial left 5th toe and lateral right 4th toe were shaved with a sterile #[id number] blade.    A gel double-buddy toe splint was dispensed for the patient to wear the avoid pressure between the toes.  Recommended Injinji toe socks (thin liner version).  Briefly discussed surgical correction of toes if this continues. F/U prn    Clerance Lav, DPM, FACFAS [facility]     [address]  [place], [place] [address]                Office [phone]  Fax [phone]

## 2023-09-21 ENCOUNTER — Other Ambulatory Visit: Payer: Self-pay | Admitting: Internal Medicine

## 2023-09-21 DIAGNOSIS — Z1231 Encounter for screening mammogram for malignant neoplasm of breast: Secondary | ICD-10-CM

## 2023-09-22 ENCOUNTER — Other Ambulatory Visit (HOSPITAL_COMMUNITY): Payer: Self-pay

## 2023-09-22 MED ORDER — TRULANCE 3 MG PO TABS
3.0000 mg | ORAL_TABLET | Freq: Every day | ORAL | 1 refills | Status: AC
  Filled 2023-09-22 – 2023-09-29 (×2): qty 30, 30d supply, fill #0

## 2023-09-22 NOTE — Telephone Encounter (Signed)
Prescription resent to patient's pharmacy. 

## 2023-09-22 NOTE — Telephone Encounter (Signed)
Inbound call from patient requesting for trulance medication to be sent to [facility] at [facility]. Please advise, thank you.

## 2023-09-22 NOTE — Addendum Note (Signed)
Addended by: Jovita Kussmaul L on: 09/22/2023 11:53 AM   Modules accepted: Orders

## 2023-09-27 ENCOUNTER — Other Ambulatory Visit: Payer: Self-pay

## 2023-09-27 ENCOUNTER — Other Ambulatory Visit (HOSPITAL_COMMUNITY): Payer: Self-pay

## 2023-09-27 ENCOUNTER — Ambulatory Visit: Payer: 59 | Admitting: Podiatry

## 2023-09-27 DIAGNOSIS — M542 Cervicalgia: Secondary | ICD-10-CM | POA: Diagnosis not present

## 2023-09-27 DIAGNOSIS — M7581 Other shoulder lesions, right shoulder: Secondary | ICD-10-CM | POA: Diagnosis not present

## 2023-09-27 MED ORDER — METHYLPREDNISOLONE 4 MG PO TABS
ORAL_TABLET | ORAL | 0 refills | Status: DC
  Filled 2023-09-27: qty 30, 10d supply, fill #0
  Filled 2023-09-27: qty 12, 2d supply, fill #0

## 2023-09-27 MED ORDER — METHOCARBAMOL 750 MG PO TABS
750.0000 mg | ORAL_TABLET | Freq: Three times a day (TID) | ORAL | 0 refills | Status: AC | PRN
  Filled 2023-09-27: qty 20, 7d supply, fill #0

## 2023-09-29 ENCOUNTER — Other Ambulatory Visit (HOSPITAL_COMMUNITY): Payer: Self-pay

## 2023-10-05 ENCOUNTER — Other Ambulatory Visit (HOSPITAL_COMMUNITY): Payer: Self-pay

## 2023-10-05 DIAGNOSIS — G4726 Circadian rhythm sleep disorder, shift work type: Secondary | ICD-10-CM | POA: Diagnosis not present

## 2023-10-05 DIAGNOSIS — R000 Tachycardia, unspecified: Secondary | ICD-10-CM | POA: Diagnosis not present

## 2023-10-05 DIAGNOSIS — R030 Elevated blood-pressure reading, without diagnosis of hypertension: Secondary | ICD-10-CM | POA: Diagnosis not present

## 2023-10-05 MED ORDER — ARMODAFINIL 150 MG PO TABS
150.0000 mg | ORAL_TABLET | Freq: Every day | ORAL | 5 refills | Status: DC
  Filled 2023-10-05: qty 30, 30d supply, fill #0

## 2023-10-07 ENCOUNTER — Other Ambulatory Visit (HOSPITAL_COMMUNITY): Payer: Self-pay

## 2023-10-12 ENCOUNTER — Other Ambulatory Visit (HOSPITAL_COMMUNITY): Payer: Self-pay | Admitting: Internal Medicine

## 2023-10-12 DIAGNOSIS — R030 Elevated blood-pressure reading, without diagnosis of hypertension: Secondary | ICD-10-CM

## 2023-10-13 DIAGNOSIS — M542 Cervicalgia: Secondary | ICD-10-CM | POA: Diagnosis not present

## 2023-10-24 ENCOUNTER — Ambulatory Visit
Admission: RE | Admit: 2023-10-24 | Discharge: 2023-10-24 | Disposition: A | Discharge status: Home / Self Care | Payer: 59 | Source: Ambulatory Visit

## 2023-10-24 DIAGNOSIS — Z1231 Encounter for screening mammogram for malignant neoplasm of breast: Secondary | ICD-10-CM

## 2023-11-10 ENCOUNTER — Other Ambulatory Visit (HOSPITAL_COMMUNITY): Payer: Self-pay

## 2023-11-10 ENCOUNTER — Ambulatory Visit (HOSPITAL_COMMUNITY): Payer: 59 | Attending: Internal Medicine

## 2023-11-10 DIAGNOSIS — G4700 Insomnia, unspecified: Secondary | ICD-10-CM | POA: Diagnosis not present

## 2023-11-10 DIAGNOSIS — R030 Elevated blood-pressure reading, without diagnosis of hypertension: Secondary | ICD-10-CM | POA: Diagnosis not present

## 2023-11-10 DIAGNOSIS — G4726 Circadian rhythm sleep disorder, shift work type: Secondary | ICD-10-CM | POA: Diagnosis not present

## 2023-11-10 DIAGNOSIS — Z8249 Family history of ischemic heart disease and other diseases of the circulatory system: Secondary | ICD-10-CM | POA: Diagnosis not present

## 2023-11-10 LAB — ECHOCARDIOGRAM COMPLETE: S' Lateral: 2.77 cm

## 2023-11-10 MED ORDER — TRAZODONE HCL 50 MG PO TABS
50.0000 mg | ORAL_TABLET | Freq: Every evening | ORAL | 0 refills | Status: AC | PRN
  Filled 2023-11-10: qty 60, 30d supply, fill #0

## 2023-11-10 MED ORDER — ARMODAFINIL 150 MG PO TABS
150.0000 mg | ORAL_TABLET | Freq: Every day | ORAL | 5 refills | Status: DC
  Filled 2023-11-10: qty 30, 30d supply, fill #0
  Filled 2024-03-13: qty 30, 30d supply, fill #1

## 2023-11-11 ENCOUNTER — Other Ambulatory Visit (HOSPITAL_COMMUNITY): Payer: Self-pay

## 2023-11-11 DIAGNOSIS — M542 Cervicalgia: Secondary | ICD-10-CM | POA: Diagnosis not present

## 2023-11-16 ENCOUNTER — Other Ambulatory Visit (HOSPITAL_COMMUNITY): Payer: Self-pay

## 2024-03-13 ENCOUNTER — Other Ambulatory Visit (HOSPITAL_COMMUNITY): Payer: Self-pay

## 2024-04-12 DIAGNOSIS — R7303 Prediabetes: Secondary | ICD-10-CM | POA: Diagnosis not present

## 2024-04-12 DIAGNOSIS — E785 Hyperlipidemia, unspecified: Secondary | ICD-10-CM | POA: Diagnosis not present

## 2024-04-12 DIAGNOSIS — R030 Elevated blood-pressure reading, without diagnosis of hypertension: Secondary | ICD-10-CM | POA: Diagnosis not present

## 2024-04-17 ENCOUNTER — Other Ambulatory Visit: Payer: Self-pay

## 2024-04-17 ENCOUNTER — Other Ambulatory Visit (HOSPITAL_COMMUNITY): Payer: Self-pay

## 2024-04-17 DIAGNOSIS — Z23 Encounter for immunization: Secondary | ICD-10-CM | POA: Diagnosis not present

## 2024-04-17 DIAGNOSIS — Z8249 Family history of ischemic heart disease and other diseases of the circulatory system: Secondary | ICD-10-CM | POA: Diagnosis not present

## 2024-04-17 DIAGNOSIS — Z1331 Encounter for screening for depression: Secondary | ICD-10-CM | POA: Diagnosis not present

## 2024-04-17 DIAGNOSIS — K5909 Other constipation: Secondary | ICD-10-CM | POA: Diagnosis not present

## 2024-04-17 DIAGNOSIS — Z0000 Encounter for general adult medical examination without abnormal findings: Secondary | ICD-10-CM | POA: Diagnosis not present

## 2024-04-17 DIAGNOSIS — Z1339 Encounter for screening examination for other mental health and behavioral disorders: Secondary | ICD-10-CM | POA: Diagnosis not present

## 2024-04-17 DIAGNOSIS — G4726 Circadian rhythm sleep disorder, shift work type: Secondary | ICD-10-CM | POA: Diagnosis not present

## 2024-04-17 DIAGNOSIS — Z860100 Personal history of colon polyps, unspecified: Secondary | ICD-10-CM | POA: Diagnosis not present

## 2024-04-17 MED ORDER — ARMODAFINIL 150 MG PO TABS
150.0000 mg | ORAL_TABLET | Freq: Every day | ORAL | 5 refills | Status: DC
  Filled 2024-04-17: qty 30, 30d supply, fill #0
  Filled 2024-08-10: qty 90, 90d supply, fill #1

## 2024-04-17 MED ORDER — TRULANCE 3 MG PO TABS
3.0000 mg | ORAL_TABLET | Freq: Every day | ORAL | 3 refills | Status: AC
  Filled 2024-04-17: qty 90, 90d supply, fill #0
  Filled 2024-08-10: qty 90, 90d supply, fill #1

## 2024-05-09 DIAGNOSIS — M25512 Pain in left shoulder: Secondary | ICD-10-CM | POA: Diagnosis not present

## 2024-05-28 NOTE — Progress Notes (Deleted)
   Kathleen Doyle [birth year] [id number]   History:  48 y.o. G0 presents for annual exam Monthly cycles. Normal pap history. H/O pre-diabetes, constipation.   Gynecologic History No LMP recorded.   Contraception/Family planning: abstinence Sexually active: No  Health Maintenance Last Pap: 03/05/2020. Results were: Normal neg HPV Last mammogram: 10/24/2023. Results were: Normal Last colonoscopy: 08/22/2023. 5-year recall Last Dexa: Not indicated  Past medical history, past surgical history, family history and social history were all reviewed and documented in the EPIC chart. ICU nurse at [employer].   ROS:  A ROS was performed and pertinent positives and negatives are included.  Exam:  There were no vitals filed for this visit.  There is no height or weight on file to calculate BMI.  General appearance:  Normal Thyroid:  Symmetrical, normal in size, without palpable masses or nodularity. Respiratory  Auscultation:  Clear without wheezing or rhonchi Cardiovascular  Auscultation:  Regular rate, without rubs, murmurs or gallops  Edema/varicosities:  Not grossly evident Abdominal  Soft,nontender, without masses, guarding or rebound.  Liver/spleen:  No organomegaly noted  Hernia:  None appreciated  Skin  Inspection:  Grossly normal Breasts: Examined lying and sitting.   Right: Without masses, retractions, nipple discharge or axillary adenopathy.   Left: Without masses, retractions, nipple discharge or axillary adenopathy. Pelvic: External genitalia:  no lesions              Urethra:  normal appearing urethra with no masses, tenderness or lesions              Bartholins and Skenes: normal                 Vagina: normal appearing vagina with normal color and discharge, no lesions              Cervix: no lesions Bimanual Exam:  Uterus:  no masses or tenderness              Adnexa: no mass, fullness, tenderness              Rectovaginal: Deferred              Anus:  normal, no  lesions  Patient informed chaperone available to be present for breast and pelvic exam. Patient has requested no chaperone to be present. Patient has been advised what will be completed during breast and pelvic exam.   Assessment/Plan:  48 y.o. G0 to establish care.   Well female exam with routine gynecological exam - Education provided on SBEs, importance of preventative screenings, current guidelines, high calcium diet, regular exercise, and multivitamin daily. Labs with PCP.   Screening for cervical cancer - Normal Pap history.  Will repeat at 5-year interval per guidelines.  Screening for breast cancer - Normal mammogram history.  Continue annual screenings.  Normal breast exam today.  Screening for colon cancer - 2021 colonoscopy. 3-year recall, due this year.   Screening for osteoporosis - Average risk. Will plan DXA at age 48.   No follow-ups on file.    Kathleen DELENA Shutter DNP, 4:16 PM 05/28/2024

## 2024-05-29 ENCOUNTER — Ambulatory Visit: Admitting: Nurse Practitioner

## 2024-05-29 DIAGNOSIS — Z01419 Encounter for gynecological examination (general) (routine) without abnormal findings: Secondary | ICD-10-CM

## 2024-06-06 ENCOUNTER — Telehealth: Admitting: Physician Assistant

## 2024-06-06 DIAGNOSIS — U071 COVID-19: Secondary | ICD-10-CM

## 2024-06-06 DIAGNOSIS — R6889 Other general symptoms and signs: Secondary | ICD-10-CM

## 2024-06-06 NOTE — Progress Notes (Signed)
   Thank you for the details you included in the comment boxes. Those details are very helpful in determining the best course of treatment for you and help us  to provide the best care. Because of having a video visit already scheduled, we recommend that you schedule a Virtual Urgent Care video visit in order for the provider to better assess what is going on.  The provider will be able to give you a more accurate diagnosis and treatment plan if we can more freely discuss your symptoms and with the addition of a virtual examination.   If you change your visit to a video visit, we will bill your insurance (similar to an office visit) and you will not be charged for this e-Visit. You will be able to stay at home and speak with the first available [facility] advanced practice provider. The link to do a video visit is in the drop down Menu tab of your Welcome screen in MyChart.     I have spent 5 minutes in review of e-visit questionnaire, review and updating patient chart, medical decision making and response to patient.   Delon CHRISTELLA Dickinson, PA-C

## 2024-06-06 NOTE — Patient Instructions (Signed)
 Kathleen Doyle, thank you for joining Kathleen Velma Lunger, PA-C for today's virtual visit.  While this provider is not your primary care provider (PCP), if your PCP is located in our provider database this encounter information will be shared with them immediately following your visit.   A [facility] MyChart account gives you access to today's visit and all your visits, tests, and labs performed at [facility]  click here if you don't have a Kathleen Doyle MyChart account or go to [email or web address]  Consent: (Patient) Kathleen Doyle provided verbal consent for this virtual visit at the beginning of the encounter.  Current Medications:  Current Outpatient Medications:    acetaminophen (TYLENOL) 325 MG tablet, Take 650 mg by mouth every 6 (six) hours as needed., Disp: , Rfl:    Armodafinil  (NUVIGIL ) 150 MG tablet, Take 1 tablet (150 mg total) by mouth daily., Disp: 30 tablet, Rfl: 5   bisacodyl (DULCOLAX) 5 MG EC tablet, as needed Oral, Disp: , Rfl:    cetirizine (ZYRTEC ALLERGY) 10 MG tablet, daily as needed for allergies Oral, Disp: , Rfl:    ibuprofen (ADVIL) 200 MG tablet, Take 400 mg by mouth every 6 (six) hours as needed., Disp: , Rfl:    melatonin 5 MG TABS, 1 tablet in the evening Orally Once a day, Disp: , Rfl:    methocarbamol  (ROBAXIN ) 750 MG tablet, Take 1 tablet by mouth 3 times a day as needed, Disp: 20 tablet, Rfl: 0   methylPREDNISolone  (MEDROL ) 4 MG tablet, Take 6 tablets by mouth daily for 2 days then decrease by 1 tablet every other day (6-6-5-5-4-4-3-3-2-2-1-1), Disp: 42 tablet, Rfl: 0   Plecanatide  (TRULANCE ) 3 MG TABS, Take 1 tablet (3 mg total) by mouth daily at 6 (six) AM., Disp: 30 tablet, Rfl: 1   Plecanatide  (TRULANCE ) 3 MG TABS, Take 1 tablet (3 mg total) by mouth daily., Disp: 90 tablet, Rfl: 3   traZODone  (DESYREL ) 50 MG tablet, Take 1-2 tablets (50-100 mg total) by mouth at bedtime as needed., Disp: 60 tablet, Rfl: 0    Medications ordered in this encounter:  No orders of the defined types were placed in this encounter.    *If you need refills on other medications prior to your next appointment, please contact your pharmacy*  Follow-Up: Call back or seek an in-person evaluation if the symptoms worsen or if the condition fails to improve as anticipated.  [facility] [phone]  Care Instructions: Please keep well-hydrated and get plenty of rest. Start a saline nasal rinse to flush out your nasal passages. You can use plain Mucinex to help thin congestion. If you have a humidifier, running in the bedroom at night. I want you to start OTC vitamin D3 1000 units daily, vitamin C 1000 mg daily, and a zinc supplement. Please take prescribed medications as directed.    Isolation Instructions: You are to isolate at home until you have been fever free for at least 24 hours without a fever-reducing medication, and symptoms have been steadily improving for 24 hours. At that time,  you can end isolation but need to mask for an additional 5 days.   If you must be around other household members who do not have symptoms, you need to make sure that both you and the family members are masking consistently with a high-quality mask.  If you note any worsening of symptoms despite treatment, please seek an in-person evaluation ASAP. If you note any significant shortness of breath  or any chest pain, please seek ER evaluation. Please do not delay care!   COVID-19: What to Do if You Are Sick If you test positive and are an older adult or someone who is at high risk of getting very sick from COVID-19, treatment may be available. Contact a healthcare provider right away after a positive test to determine if you are eligible, even if your symptoms are mild right now. You can also visit a Test to Treat location and, if eligible, receive a prescription from a provider. Don't delay: Treatment must be started  within the first few days to be effective. If you have a fever, cough, or other symptoms, you might have COVID-19. Most people have mild illness and are able to recover at home. If you are sick: Keep track of your symptoms. If you have an emergency warning sign (including trouble breathing), call 911. Steps to help prevent the spread of COVID-19 if you are sick If you are sick with COVID-19 or think you might have COVID-19, follow the steps below to care for yourself and to help protect other people in your home and community. Stay home except to get medical care Stay home. Most people with COVID-19 have mild illness and can recover at home without medical care. Do not leave your home, except to get medical care. Do not visit public areas and do not go to places where you are unable to wear a mask. Take care of yourself. Get rest and stay hydrated. Take over-the-counter medicines, such as acetaminophen, to help you feel better. Stay in touch with your doctor. Call before you get medical care. Be sure to get care if you have trouble breathing, or have any other emergency warning signs, or if you think it is an emergency. Avoid public transportation, ride-sharing, or taxis if possible. Get tested If you have symptoms of COVID-19, get tested. While waiting for test results, stay away from others, including staying apart from those living in your household. Get tested as soon as possible after your symptoms start. Treatments may be available for people with COVID-19 who are at risk for becoming very sick. Don't delay: Treatment must be started early to be effective--some treatments must begin within 5 days of your first symptoms. Contact your healthcare provider right away if your test result is positive to determine if you are eligible. Self-tests are one of several options for testing for the virus that causes COVID-19 and may be more convenient than laboratory-based tests and point-of-care tests. Ask  your healthcare provider or your local health department if you need help interpreting your test results. You can visit your state, tribal, local, and territorial health department's website to look for the latest local information on testing sites. Separate yourself from other people As much as possible, stay in a specific room and away from other people and pets in your home. If possible, you should use a separate bathroom. If you need to be around other people or animals in or outside of the home, wear a well-fitting mask. Tell your close contacts that they may have been exposed to COVID-19. An infected person can spread COVID-19 starting 48 hours (or 2 days) before the person has any symptoms or tests positive. By letting your close contacts know they may have been exposed to COVID-19, you are helping to protect everyone. See COVID-19 and Animals if you have questions about pets. If you are diagnosed with COVID-19, someone from the health department may call you. Answer the  call to slow the spread. Monitor your symptoms Symptoms of COVID-19 include fever, cough, or other symptoms. Follow care instructions from your healthcare provider and local health department. Your local health authorities may give instructions on checking your symptoms and reporting information. When to seek emergency medical attention Look for emergency warning signs* for COVID-19. If someone is showing any of these signs, seek emergency medical care immediately: Trouble breathing Persistent pain or pressure in the chest New confusion Inability to wake or stay awake Pale, gray, or blue-colored skin, lips, or nail beds, depending on skin tone *This list is not all possible symptoms. Please call your medical provider for any other symptoms that are severe or concerning to you. Call 911 or call ahead to your local emergency facility: Notify the operator that you are seeking care for someone who has or may have COVID-19. Call  ahead before visiting your doctor Call ahead. Many medical visits for routine care are being postponed or done by phone or telemedicine. If you have a medical appointment that cannot be postponed, call your doctor's office, and tell them you have or may have COVID-19. This will help the office protect themselves and other patients. If you are sick, wear a well-fitting mask You should wear a mask if you must be around other people or animals, including pets (even at home). Wear a mask with the best fit, protection, and comfort for you. You don't need to wear the mask if you are alone. If you can't put on a mask (because of trouble breathing, for example), cover your coughs and sneezes in some other way. Try to stay at least 6 feet away from other people. This will help protect the people around you. Masks should not be placed on young children under age 46 years, anyone who has trouble breathing, or anyone who is not able to remove the mask without help. Cover your coughs and sneezes Cover your mouth and nose with a tissue when you cough or sneeze. Throw away used tissues in a lined trash can. Immediately wash your hands with soap and water for at least 20 seconds. If soap and water are not available, clean your hands with an alcohol-based hand sanitizer that contains at least 60% alcohol. Clean your hands often Wash your hands often with soap and water for at least 20 seconds. This is especially important after blowing your nose, coughing, or sneezing; going to the bathroom; and before eating or preparing food. Use hand sanitizer if soap and water are not available. Use an alcohol-based hand sanitizer with at least 60% alcohol, covering all surfaces of your hands and rubbing them together until they feel dry. Soap and water are the best option, especially if hands are visibly dirty. Avoid touching your eyes, nose, and mouth with unwashed hands. Handwashing Tips Avoid sharing personal household  items Do not share dishes, drinking glasses, cups, eating utensils, towels, or bedding with other people in your home. Wash these items thoroughly after using them with soap and water or put in the dishwasher. Clean surfaces in your home regularly Clean and disinfect high-touch surfaces (for example, doorknobs, tables, handles, light switches, and countertops) in your sick room and bathroom. In shared spaces, you should clean and disinfect surfaces and items after each use by the person who is ill. If you are sick and cannot clean, a caregiver or other person should only clean and disinfect the area around you (such as your bedroom and bathroom) on an as needed basis.  Your caregiver/other person should wait as long as possible (at least several hours) and wear a mask before entering, cleaning, and disinfecting shared spaces that you use. Clean and disinfect areas that may have blood, stool, or body fluids on them. Use household cleaners and disinfectants. Clean visible dirty surfaces with household cleaners containing soap or detergent. Then, use a household disinfectant. Use a product from [employer] List N: Disinfectants for Coronavirus (COVID-19). Be sure to follow the instructions on the label to ensure safe and effective use of the product. Many products recommend keeping the surface wet with a disinfectant for a certain period of time (look at contact time on the product label). You may also need to wear personal protective equipment, such as gloves, depending on the directions on the product label. Immediately after disinfecting, wash your hands with soap and water for 20 seconds. For completed guidance on cleaning and disinfecting your home, visit Complete Disinfection Guidance. Take steps to improve ventilation at home Improve ventilation (air flow) at home to help prevent from spreading COVID-19 to other people in your household. Clear out COVID-19 virus particles in the air by opening  windows, using air filters, and turning on fans in your home. Use this interactive tool to learn how to improve air flow in your home. When you can be around others after being sick with COVID-19 Deciding when you can be around others is different for different situations. Find out when you can safely end home isolation. For any additional questions about your care, contact your healthcare provider or state or local health department. 02/17/2021 Content source: [facility] ([facility]), Division of Viral Diseases This information is not intended to replace advice given to you by your health care provider. Make sure you discuss any questions you have with your health care provider. Document Revised: 04/02/2021 Document Reviewed: 04/02/2021 Elsevier Patient Education  2022 [employer].     If you have been instructed to have an in-person evaluation today at a local Urgent Care facility, please use the link below. It will take you to a list of all of our available [facility], including address, phone number and hours of operation. Please do not delay care.  [facility]  If you or a family member do not have a primary care provider, use the link below to schedule a visit and establish care. When you choose a [facility] primary care physician or advanced practice provider, you gain a long-term partner in health. Find a Primary Care Provider  Learn more about [facility] in-office and virtual care options: [facility] - Get Care Now

## 2024-06-06 NOTE — Progress Notes (Signed)
 Virtual Visit Consent   Kathleen Doyle, you are scheduled for a virtual visit with a [facility] provider today. Just as with appointments in the office, your consent must be obtained to participate. Your consent will be active for this visit and any virtual visit you may have with one of our providers in the next 365 days. If you have a MyChart account, a copy of this consent can be sent to you electronically.  As this is a virtual visit, video technology does not allow for your provider to perform a traditional examination. This may limit your provider's ability to fully assess your condition. If your provider identifies any concerns that need to be evaluated in person or the need to arrange testing (such as labs, EKG, etc.), we will make arrangements to do so. Although advances in technology are sophisticated, we cannot ensure that it will always work on either your end or our end. If the connection with a video visit is poor, the visit may have to be switched to a telephone visit. With either a video or telephone visit, we are not always able to ensure that we have a secure connection.  By engaging in this virtual visit, you consent to the provision of healthcare and authorize for your insurance to be billed (if applicable) for the services provided during this visit. Depending on your insurance coverage, you may receive a charge related to this service.  I need to obtain your verbal consent now. Are you willing to proceed with your visit today? Kathleen Doyle has provided verbal consent on 06/06/2024 for a virtual visit (video or telephone). [name], [place]  Date: 06/06/2024 10:23 AM   Virtual Visit via Video Note   I, [name], connected with  Kathleen Doyle  ([id number], 1976/04/21) on 06/06/24 at 10:00 AM EDT by a video-enabled telemedicine application and verified that I am speaking with the correct person using two  identifiers.  Location: Patient: Virtual Visit Location Patient: Home Provider: Virtual Visit Location Provider: Home Office   I discussed the limitations of evaluation and management by telemedicine and the availability of in person appointments. The patient expressed understanding and agreed to proceed.    History of Present Illness: Kathleen Doyle is a 48 y.o. who identifies as a female who was assigned female at birth, and is being seen today for COVID-19. Endorses over the past couple of days noting a mild headache, neck and back ache with mild sore throat. Notes some mild nasal stuffiness/congestion. Denies fever, chills. Was going to visit a friend who just had a kidney transplant so tested for COVID which came back positive this morning. Denies chest pain or SOB.    HPI: HPI  Problems:  Patient Active Problem List   Diagnosis Date Noted   History of colonic polyps 04/10/2021   Elevated blood-pressure reading without diagnosis of hypertension 03/12/2020   Mild intermittent asthma 03/12/2020   History of peptic ulcer 03/12/2020    Allergies:  Allergies  Allergen Reactions   Merthiolate [Thimerosal (Thiomersal)] Rash   Penicillins Rash   Medications:  Current Outpatient Medications:    acetaminophen (TYLENOL) 325 MG tablet, Take 650 mg by mouth every 6 (six) hours as needed., Disp: , Rfl:    Armodafinil  (NUVIGIL ) 150 MG tablet, Take 1 tablet (150 mg total) by mouth daily., Disp: 30 tablet, Rfl: 5   bisacodyl (DULCOLAX) 5 MG EC tablet, as needed Oral, Disp: , Rfl:    cetirizine (ZYRTEC ALLERGY)  10 MG tablet, daily as needed for allergies Oral, Disp: , Rfl:    ibuprofen (ADVIL) 200 MG tablet, Take 400 mg by mouth every 6 (six) hours as needed., Disp: , Rfl:    melatonin 5 MG TABS, 1 tablet in the evening Orally Once a day, Disp: , Rfl:    methocarbamol  (ROBAXIN ) 750 MG tablet, Take 1 tablet by mouth 3 times a day as needed, Disp: 20 tablet, Rfl: 0   methylPREDNISolone   (MEDROL ) 4 MG tablet, Take 6 tablets by mouth daily for 2 days then decrease by 1 tablet every other day (6-6-5-5-4-4-3-3-2-2-1-1), Disp: 42 tablet, Rfl: 0   Plecanatide  (TRULANCE ) 3 MG TABS, Take 1 tablet (3 mg total) by mouth daily at 6 (six) AM., Disp: 30 tablet, Rfl: 1   Plecanatide  (TRULANCE ) 3 MG TABS, Take 1 tablet (3 mg total) by mouth daily., Disp: 90 tablet, Rfl: 3   traZODone  (DESYREL ) 50 MG tablet, Take 1-2 tablets (50-100 mg total) by mouth at bedtime as needed., Disp: 60 tablet, Rfl: 0  Observations/Objective: Patient is well-developed, well-nourished in no acute distress.  Resting comfortably at home.  Head is normocephalic, atraumatic.  No labored breathing.  Speech is clear and coherent with logical content.  Patient is alert and oriented at baseline.   Assessment and Plan: 1. COVID-19 (Primary)  Milder symptoms.  No indication for antiviral at present time as risk of ADR from medication > likely benefit -- patient agrees with this. Supportive measures, OTC medications and Vitamin regimen reviewed. Strict ER precautions reviewed.   Follow Up Instructions: I discussed the assessment and treatment plan with the patient. The patient was provided an opportunity to ask questions and all were answered. The patient agreed with the plan and demonstrated an understanding of the instructions.  A copy of instructions were sent to the patient via MyChart unless otherwise noted below.   The patient was advised to call back or seek an in-person evaluation if the symptoms worsen or if the condition fails to improve as anticipated.    [name], PA-C

## 2024-07-03 ENCOUNTER — Ambulatory Visit (INDEPENDENT_AMBULATORY_CARE_PROVIDER_SITE_OTHER): Admitting: Nurse Practitioner

## 2024-07-03 ENCOUNTER — Encounter: Payer: Self-pay | Admitting: Nurse Practitioner

## 2024-07-03 ENCOUNTER — Other Ambulatory Visit (HOSPITAL_COMMUNITY)
Admission: RE | Admit: 2024-07-03 | Discharge: 2024-07-03 | Disposition: A | Discharge status: Home / Self Care | Source: Ambulatory Visit | Attending: Nurse Practitioner | Admitting: Nurse Practitioner

## 2024-07-03 VITALS — BP 120/76 | HR 94 | Ht 60.25 in | Wt 137.0 lb

## 2024-07-03 DIAGNOSIS — Z124 Encounter for screening for malignant neoplasm of cervix: Secondary | ICD-10-CM | POA: Diagnosis not present

## 2024-07-03 DIAGNOSIS — Z01419 Encounter for gynecological examination (general) (routine) without abnormal findings: Secondary | ICD-10-CM | POA: Insufficient documentation

## 2024-07-03 DIAGNOSIS — Z1331 Encounter for screening for depression: Secondary | ICD-10-CM

## 2024-07-03 NOTE — Progress Notes (Signed)
   Kathleen Doyle 1975-12-08 [id number]   History:  48 y.o. G0 presents for annual exam. Monthly cycles. Normal pap history. H/O pre-diabetes, constipation.   Gynecologic History Patient's last menstrual period was 06/23/2024 (exact date). Period Duration (Days): 3-5 Period Pattern: Regular Menstrual Flow: Light, Moderate Menstrual Control: Maxi pad Dysmenorrhea: (!) Moderate Dysmenorrhea Symptoms: Cramping, Headache Contraception/Family planning: abstinence Sexually active: No  Health Maintenance Last Pap: 03/05/2020. Results were: Normal neg HPV Last mammogram: 10/24/2023. Results were: Normal Last colonoscopy: 08/22/2023 Last Dexa: Not indicated     07/03/2024    8:26 AM  Depression screen PHQ 2/9  Decreased Interest 0  Down, Depressed, Hopeless 0  PHQ - 2 Score 0     Past medical history, past surgical history, family history and social history were all reviewed and documented in the EPIC chart. 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   ROS:  A ROS was performed and pertinent positives and negatives are included.  Exam:  Vitals:   07/03/24 0825  BP: 120/76  Pulse: 94  SpO2: 98%  Weight: 137 lb (62.1 kg)  Height: 5' 0.25 (1.53 m)    Body mass index is 26.53 kg/m.  General appearance:  Normal Thyroid:  Symmetrical, normal in size, without palpable masses or nodularity. Respiratory  Auscultation:  Clear without wheezing or rhonchi Cardiovascular  Auscultation:  Regular rate, without rubs, murmurs or gallops  Edema/varicosities:  Not grossly evident Abdominal  Soft,nontender, without masses, guarding or rebound.  Liver/spleen:  No organomegaly noted  Hernia:  None appreciated  Skin  Inspection:  Grossly normal Breasts: Examined lying and sitting.   Right: Without masses, retractions, nipple discharge or axillary adenopathy.   Left: Without masses, retractions, nipple discharge or axillary adenopathy. Pelvic: External genitalia:  no lesions              Urethra:   normal appearing urethra with no masses, tenderness or lesions              Bartholins and Skenes: normal                 Vagina: normal appearing vagina with normal color and discharge, no lesions              Cervix: no lesions Bimanual Exam:  Uterus:  no masses or tenderness              Adnexa: no mass, fullness, tenderness              Rectovaginal: Deferred              Anus:  normal, no lesions  Kathleen Mole, NP student performed exam with supervision.   Assessment/Plan:  48 y.o. G0 for annual exam.   Well female exam with routine gynecological exam - Education provided on SBEs, importance of preventative screenings, current guidelines, high calcium diet, regular exercise, and multivitamin daily. Labs with PCP.   Cervical cancer screening - Plan: Cytology - PAP( ). Normal Pap history.   Screening for breast cancer - Normal mammogram history.  Continue annual screenings.  Normal breast exam today.  Screening for colon cancer - 2024 colonoscopy. Will repeat at GI's recommended interval.   Screening for osteoporosis - Average risk. Will plan DXA at age 2.   Return in about 1 year (around 07/03/2025) for Annual.    Kathleen DELENA Shutter DNP, 8:54 AM 07/03/2024

## 2024-07-06 ENCOUNTER — Ambulatory Visit: Payer: Self-pay | Admitting: Nurse Practitioner

## 2024-07-06 LAB — CYTOLOGY - PAP
Adequacy: ABSENT
Comment: NEGATIVE
Diagnosis: NEGATIVE
High risk HPV: NEGATIVE

## 2024-08-10 ENCOUNTER — Other Ambulatory Visit (HOSPITAL_COMMUNITY): Payer: Self-pay

## 2024-08-10 ENCOUNTER — Other Ambulatory Visit: Payer: Self-pay

## 2024-08-13 ENCOUNTER — Other Ambulatory Visit (HOSPITAL_COMMUNITY): Payer: Self-pay

## 2024-08-13 ENCOUNTER — Other Ambulatory Visit: Payer: Self-pay

## 2024-09-04 ENCOUNTER — Encounter (HOSPITAL_COMMUNITY): Payer: Self-pay | Admitting: *Deleted

## 2024-09-04 ENCOUNTER — Ambulatory Visit (HOSPITAL_COMMUNITY)
Admission: EM | Admit: 2024-09-04 | Discharge: 2024-09-04 | Disposition: A | Discharge status: Home / Self Care | Attending: Family Medicine | Admitting: Family Medicine

## 2024-09-04 ENCOUNTER — Ambulatory Visit (INDEPENDENT_AMBULATORY_CARE_PROVIDER_SITE_OTHER)

## 2024-09-04 DIAGNOSIS — S99911A Unspecified injury of right ankle, initial encounter: Secondary | ICD-10-CM | POA: Diagnosis not present

## 2024-09-04 DIAGNOSIS — M79671 Pain in right foot: Secondary | ICD-10-CM

## 2024-09-04 DIAGNOSIS — M25571 Pain in right ankle and joints of right foot: Secondary | ICD-10-CM

## 2024-09-04 DIAGNOSIS — S99921A Unspecified injury of right foot, initial encounter: Secondary | ICD-10-CM | POA: Diagnosis not present

## 2024-09-04 DIAGNOSIS — S92901A Unspecified fracture of right foot, initial encounter for closed fracture: Secondary | ICD-10-CM | POA: Diagnosis not present

## 2024-09-04 NOTE — ED Triage Notes (Signed)
 Pt states that she was walking in parking lot at [facility] prior to clocking in and slipped she twisted her right ankle. She took IBU 400mg  last night and once this morning.

## 2024-09-04 NOTE — ED Provider Notes (Signed)
 [facility]    CSN: [id number] Arrival date & time: 09/04/24  9177      History   Chief Complaint Chief Complaint  Patient presents with   Ankle Injury    HPI Kathleen Doyle is a 48 y.o. female.    Ankle Injury  Here for right ankle and foot pain. Last night she was walking into work and slipped and twisted her right ankle.  She now has pain in the lateral right ankle and onto the proximal right foot.  400 mg of ibuprofen was fairly helpful  There is some swelling  She is allergic to penicillin and merthiolate    Past Medical History:  Diagnosis Date   Allergy    Anemia    Asthma    as a child, no inhaler no problems as an adult    Blood transfusion without reported diagnosis    Dengue fever    hospitalized 2 weeks   Multiple gastric ulcers     Patient Active Problem List   Diagnosis Date Noted   History of colonic polyps 04/10/2021   Elevated blood-pressure reading without diagnosis of hypertension 03/12/2020   Mild intermittent asthma 03/12/2020   History of peptic ulcer 03/12/2020    Past Surgical History:  Procedure Laterality Date   COLONOSCOPY     RHINOPLASTY      OB History     Gravida  0   Para  0   Term  0   Preterm  0   AB  0   Living  0      SAB  0   IAB  0   Ectopic  0   Multiple  0   Live Births  0            Home Medications    Prior to Admission medications   Medication Sig Start Date End Date Taking? Authorizing Provider  Armodafinil  (NUVIGIL ) 150 MG tablet Take 1 tablet (150 mg total) by mouth daily. 04/17/24  Yes   ibuprofen (ADVIL) 200 MG tablet Take 400 mg by mouth every 6 (six) hours as needed.   Yes [provider]  Plecanatide  (TRULANCE ) 3 MG TABS Take 1 tablet (3 mg total) by mouth daily. 04/17/24  Yes   acetaminophen (TYLENOL) 325 MG tablet Take 650 mg by mouth every 6 (six) hours as needed.    [provider]  bisacodyl (DULCOLAX) 5 MG EC tablet as needed  Oral 03/12/20   [provider]  cetirizine (ZYRTEC ALLERGY) 10 MG tablet daily as needed for allergies Oral 03/12/20   [provider]  melatonin 5 MG TABS 1 tablet in the evening Orally Once a day    [provider]  methocarbamol  (ROBAXIN ) 750 MG tablet Take 1 tablet by mouth 3 times a day as needed 09/27/23     Plecanatide  (TRULANCE ) 3 MG TABS Take 1 tablet (3 mg total) by mouth daily at 6 (six) AM. 09/22/23   Pyrtle, Gordy HERO, MD  traZODone  (DESYREL ) 50 MG tablet Take 1-2 tablets (50-100 mg total) by mouth at bedtime as needed. 11/10/23       Family History Family History  Problem Relation Age of Onset   Kidney Stones Mother    Heart disease Father    Lung cancer Paternal Grandfather    Brain cancer Paternal Grandfather    Breast cancer Maternal Aunt    Breast cancer Paternal Aunt    Colon cancer Neg Hx    Stomach  cancer Neg Hx    Rectal cancer Neg Hx     Social History Social History   Tobacco Use   Smoking status: Never   Smokeless tobacco: Never  Vaping Use   Vaping status: Never Used  Substance Use Topics   Alcohol  use: Yes    Comment: occasionally   Drug use: Never     Allergies   Merthiolate [thimerosal (thiomersal)] and Penicillins   Review of Systems Review of Systems   Physical Exam Triage Vital Signs ED Triage Vitals  Encounter Vitals Group     BP 09/04/24 0918 (!) 160/103     Girls Systolic BP Percentile --      Girls Diastolic BP Percentile --      Boys Systolic BP Percentile --      Boys Diastolic BP Percentile --      Pulse Rate 09/04/24 0918 83     Resp 09/04/24 0918 16     Temp 09/04/24 0918 98.7 F (37.1 C)     Temp Source 09/04/24 0918 Oral     SpO2 09/04/24 0918 98 %     Weight --      Height --      Head Circumference --      Peak Flow --      Pain Score 09/04/24 0916 6     Pain Loc --      Pain Education --      Exclude from Growth Chart --    No data found.  Updated Vital Signs BP (!) 160/103 (BP  Location: Left Arm)   Pulse 83   Temp 98.7 F (37.1 C) (Oral)   Resp 16   LMP 08/29/2024 (Approximate)   SpO2 98%   Visual Acuity Right Eye Distance:   Left Eye Distance:   Bilateral Distance:    Right Eye Near:   Left Eye Near:    Bilateral Near:     Physical Exam Vitals reviewed.  Constitutional:      General: She is not in acute distress.    Appearance: She is not toxic-appearing.  Musculoskeletal:     Comments: There is some swelling and tenderness over the right lateral malleolus and onto the proximal right foot and anterior ankle.  Skin:    Coloration: Skin is not pale.  Neurological:     General: No focal deficit present.     Mental Status: She is alert and oriented to person, place, and time.      UC Treatments / Results  Labs (all labs ordered are listed, but only abnormal results are displayed) Labs Reviewed - No data to display  EKG   Radiology DG Ankle Complete Right Result Date: 09/04/2024 CLINICAL DATA:  Ankle injury EXAM: DG ANKLE COMPLETE 3+V*R* COMPARISON:  Same day foot radiographs FINDINGS: There is no evidence of fracture, dislocation, or joint effusion. Plantar calcaneal enthesophyte. Soft tissues are unremarkable. IMPRESSION: No acute osseous findings. Electronically Signed   By: Michaeline Blanch M.D.   On: 09/04/2024 10:14    Procedures Procedures (including critical care time)  Medications Ordered in UC Medications - No data to display  Initial Impression / Assessment and Plan / UC Course  I have reviewed the triage vital signs and the nursing notes.  Pertinent labs & imaging results that were available during my care of the patient were reviewed by me and considered in my medical decision making (see chart for details).     No fractures are seen by radiology on the  ankle x-rays.  By my review on the AP view of the foot, there is a possible avulsion fracture of the cuboid and may be navicular bone.  She is advised of radiology over  read  Boot is supplied and she wants to continue taking ibuprofen over-the-counter.  Work note is supplied She is given contact information for podiatry Final Clinical Impressions(s) / UC Diagnoses   Final diagnoses:  Right foot pain  Acute right ankle pain  Closed fracture of right foot, initial encounter     Discharge Instructions      By my review there is a probable avulsion fracture on the side of your calcaneus and cuboid bones.  You can continue to take the ibuprofen over-the-counter as you have been doing  Please call the podiatry clinic for an appointment.     ED Prescriptions   None    PDMP not reviewed this encounter.   Vonna Sharlet POUR, MD 09/04/24 1025

## 2024-09-04 NOTE — Discharge Instructions (Addendum)
 By my review there is a probable avulsion fracture on the side of your calcaneus and cuboid bones.  You can continue to take the ibuprofen over-the-counter as you have been doing  Please call the podiatry clinic for an appointment.

## 2024-09-05 ENCOUNTER — Ambulatory Visit (HOSPITAL_COMMUNITY)

## 2024-09-07 ENCOUNTER — Ambulatory Visit

## 2024-09-07 DIAGNOSIS — S92214A Nondisplaced fracture of cuboid bone of right foot, initial encounter for closed fracture: Secondary | ICD-10-CM

## 2024-09-07 DIAGNOSIS — S92034A Nondisplaced avulsion fracture of tuberosity of right calcaneus, initial encounter for closed fracture: Secondary | ICD-10-CM | POA: Diagnosis not present

## 2024-09-07 NOTE — Progress Notes (Signed)
  Subjective:  Patient ID: Kathleen Doyle, female    DOB: 07/22/1976,  MRN: [id number]  Chief Complaint  Patient presents with   Fracture    Rm21 Right ankle fx/ Patient said she injured it on Monday night slipped and rolled her ankle/cam walker from ER    Discussed the use of AI scribe software for clinical note transcription with the patient, who gave verbal consent to proceed.  History of Present Illness Kathleen Doyle is a 48 year old female who presents with foot pain following a fall.  She fell in the parking deck of [facility] on Monday evening, resulting in significant foot pain. She was diagnosed with an avulsion fracture of the calcaneus and cuboid bone at urgent care the following morning. The pain is moderate and worsens with movements such as pointing her toes. She uses a cam walker boot, which has improved her ability to walk. She is concerned about the potential for increased pain during activities like walking her dog and taking short walks.       Objective:    Physical Exam   Constitutional Well developed. Well nourished. Oriented to person, place, and time.  Vascular Dorsalis pedis pulses intact bilaterally. Posterior tibial pulses intact bilaterally. Capillary refill normal to all digits.  No cyanosis or clubbing noted. Pedal hair growth normal.  Neurologic Normal speech. Epicritic sensation to light touch grossly intact bilaterally. Negative tinel sign at tarsal tunnel bilaterally.   Dermatologic Moderate edema and ecchymosis centered around the lateral hindfoot of the right foot no open wounds no skin lesions.  Musculoskeletal: 4/5 muscle strength to right foot eversion. 5/5 muscle strength to all other major pedal muscle groups.  Pain to palpation of the lateral calcaneocuboid joint, peroneal tendons.  Remainder of musculoskeletal exam deferred.   Radiographs: Recently taken ankle and foot x-rays from urgent care were  reviewed today.  These do demonstrate an avulsion fracture of the lateral aspect of the calcaneocuboid joint.  There is minimal displacement of the avulsion fractures.  No other fractures are identified.   Assessment:   1. Closed nondisplaced avulsion fracture of tuberosity of right calcaneus, initial encounter   2. Closed nondisplaced fracture of cuboid of right foot, initial encounter      Plan:  Patient was evaluated and treated and all questions answered.  Assessment and Plan Assessment & Plan Right calcaneus and cuboid avulsion fractures Minor avulsion fractures on the lateral aspect of the right calcaneus and cuboid. Tendons intact. Expected to heal without significant issues. - Advise weight-bearing as tolerated in a cam walker boot for 3-4 weeks or until pain-free. - Slowly transition to a tennis shoe with ankle brace once pain-free, starting with one hour/day and increasing as tolerated. Once pain free in ankle brace and standard shoes she can gradually transition away from ankle brace. She was fitted for and dispensed an ankle brace today.  - Provided a compression sock for swelling.  - Avoid long walks until confident in ability to walk without increasing pain - Schedule follow-up appointment in six weeks to assess healing progress.    Return in about 6 weeks (around 10/19/2024).

## 2024-10-17 ENCOUNTER — Other Ambulatory Visit (HOSPITAL_COMMUNITY): Payer: Self-pay

## 2024-10-17 DIAGNOSIS — S92001D Unspecified fracture of right calcaneus, subsequent encounter for fracture with routine healing: Secondary | ICD-10-CM | POA: Diagnosis not present

## 2024-10-17 DIAGNOSIS — R030 Elevated blood-pressure reading, without diagnosis of hypertension: Secondary | ICD-10-CM | POA: Diagnosis not present

## 2024-10-17 DIAGNOSIS — G4726 Circadian rhythm sleep disorder, shift work type: Secondary | ICD-10-CM | POA: Diagnosis not present

## 2024-10-17 DIAGNOSIS — N951 Menopausal and female climacteric states: Secondary | ICD-10-CM | POA: Diagnosis not present

## 2024-10-17 DIAGNOSIS — J4520 Mild intermittent asthma, uncomplicated: Secondary | ICD-10-CM | POA: Diagnosis not present

## 2024-10-17 MED ORDER — ARMODAFINIL 150 MG PO TABS
150.0000 mg | ORAL_TABLET | Freq: Every day | ORAL | 5 refills | Status: AC
  Filled 2024-10-17: qty 30, 30d supply, fill #0

## 2024-10-17 MED ORDER — TRAZODONE HCL 50 MG PO TABS
50.0000 mg | ORAL_TABLET | Freq: Every evening | ORAL | 5 refills | Status: AC
  Filled 2024-10-17: qty 60, 30d supply, fill #0

## 2024-10-19 ENCOUNTER — Ambulatory Visit (INDEPENDENT_AMBULATORY_CARE_PROVIDER_SITE_OTHER): Payer: Worker's Compensation

## 2024-10-19 ENCOUNTER — Ambulatory Visit (INDEPENDENT_AMBULATORY_CARE_PROVIDER_SITE_OTHER)

## 2024-10-19 DIAGNOSIS — S92214A Nondisplaced fracture of cuboid bone of right foot, initial encounter for closed fracture: Secondary | ICD-10-CM

## 2024-10-19 DIAGNOSIS — S92034A Nondisplaced avulsion fracture of tuberosity of right calcaneus, initial encounter for closed fracture: Secondary | ICD-10-CM

## 2024-10-19 NOTE — Progress Notes (Signed)
 Subjective:  Patient ID: Kathleen Doyle, female    DOB: 04/23/1976,  MRN: [id number]  Chief Complaint  Patient presents with   Fracture    Rm5 F/u avulsion fracture right calcaneus/pt says that she is doing well but feels a dull ache.    Discussed the use of AI scribe software for clinical note transcription with the patient, who gave verbal consent to proceed.  History of Present Illness HPI: Patient returns to clinic 6 weeks after an injury that caused an avulsion fracture to her right calcaneus and cuboid.  She was placed into a cam walker.  She states that she has transitioned herself out of the cam walker into an ankle brace.  She is doing overall very well, if she is standing for an extended period of time she does find that she is sore at the end of the day.  She has intermittently utilized the cam walker when she has extended ambulation.  Interval history 09/07/24: Kathleen Doyle is a 48 year old female who presents with foot pain following a fall.  She fell in the parking deck of [facility] on Monday evening, resulting in significant foot pain. She was diagnosed with an avulsion fracture of the calcaneus and cuboid bone at urgent care the following morning. The pain is moderate and worsens with movements such as pointing her toes. She uses a cam walker boot, which has improved her ability to walk. She is concerned about the potential for increased pain during activities like walking her dog and taking short walks.       Objective:    Physical Exam   Constitutional Well developed. Well nourished. Oriented to person, place, and time.  Vascular Dorsalis pedis pulses intact bilaterally. Posterior tibial pulses intact bilaterally. Capillary refill normal to all digits.  No cyanosis or clubbing noted. Pedal hair growth normal.  Neurologic Normal speech. Epicritic sensation to light touch grossly intact bilaterally. Negative tinel sign at tarsal  tunnel bilaterally.   Dermatologic Moderate edema and ecchymosis centered around the lateral hindfoot of the right foot no open wounds no skin lesions.  Musculoskeletal:  5/5 muscle strength to all major pedal muscle groups.  Minimal pain to palpation of the lateral calcaneocuboid joint, peroneal tendons.  Remainder of musculoskeletal exam deferred.   Radiographs: 3 weightbearing views of the right foot were taken today.  These show avulsion fractures off of the calcaneus and cuboid.  There is early osseous bridging consolidation to the calcaneal avulsion fracture.  No clear bridging to the cuboid avulsion fracture.  No change in displacement.  No other acute osseous findings.   Assessment:   1. Closed nondisplaced avulsion fracture of tuberosity of right calcaneus, initial encounter   2. Closed nondisplaced fracture of cuboid of right foot, initial encounter      Plan:  Patient was evaluated and treated and all questions answered.  Assessment and Plan Assessment & Plan Right calcaneus and cuboid avulsion fractures Minor avulsion fractures on the lateral aspect of the right calcaneus and cuboid. Tendons intact. Expected to heal without significant issues. - She is doing well in her recovery and has transitioned out of the cam walker into the ankle brace.  I emphasized the importance of utilizing this with stable supportive shoes.  She expresses understanding. - She feels that if she is walking all day, she is sore at the end of the day.  She sometimes utilizes the cam walker and states that this does feel better for her while  ambulating extended distances.  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  She is to be allowed intervals of rest as needed to elevate the extremity.  She should also be allowed to transition in and out of the cam walker as she feels appropriate.  She has no formal restrictions. - She is to return to clinic in 6 weeks for repeat evaluation with  preclinical x-rays.  Prentice Ovens, DPM

## 2024-11-30 ENCOUNTER — Ambulatory Visit

## 2024-12-07 ENCOUNTER — Ambulatory Visit

## 2024-12-07 ENCOUNTER — Other Ambulatory Visit: Payer: Self-pay | Admitting: Internal Medicine

## 2024-12-07 ENCOUNTER — Ambulatory Visit: Payer: Worker's Compensation

## 2024-12-07 DIAGNOSIS — S92034A Nondisplaced avulsion fracture of tuberosity of right calcaneus, initial encounter for closed fracture: Secondary | ICD-10-CM | POA: Diagnosis not present

## 2024-12-07 DIAGNOSIS — Z1231 Encounter for screening mammogram for malignant neoplasm of breast: Secondary | ICD-10-CM

## 2024-12-07 NOTE — Progress Notes (Addendum)
" ° °  °  Subjective:  Patient ID: Kathleen Doyle, female    DOB: 03-29-1976,  MRN: [id number]  Chief Complaint  Patient presents with   Fracture    Rm5 Follow up fx right foot 6wks/ Patient says that she still has some pain with prolong standing and walking.    Discussed the use of AI scribe software for clinical note transcription with the patient, who gave verbal consent to proceed.  History of Present Illness Patient returns to clinic approximately 12 weeks after an injury that caused an avulsion fracture to her right calcaneus and cuboid.  She has transitioned out of the cam walker and is in standard shoe gear.  She states that the foot is improving and pain.  She mostly experiences pain at the end of the day after walking on it.  Interval history 10/19/24: Patient returns to clinic 6 weeks after an injury that caused an avulsion fracture to her right calcaneus and cuboid.  She was placed into a cam walker.  She states that she has transitioned herself out of the cam walker into an ankle brace.  She is doing overall very well, if she is standing for an extended period of time she does find that she is sore at the end of the day.  She has intermittently utilized the cam walker when she has extended ambulation.        Objective:    Physical Exam   Constitutional Well developed. Well nourished. Oriented to person, place, and time.  Vascular Dorsalis pedis pulses intact bilaterally. Posterior tibial pulses intact bilaterally. Capillary refill normal to all digits.  No cyanosis or clubbing noted. Pedal hair growth normal.  Neurologic Normal speech. Epicritic sensation to light touch grossly intact bilaterally. Negative tinel sign at tarsal tunnel bilaterally.   Dermatologic Moderate edema and ecchymosis centered around the lateral hindfoot of the right foot no open wounds no skin lesions.  Musculoskeletal:  5/5 muscle strength to all major pedal muscle groups.  Minimal  pain to palpation of the lateral calcaneocuboid joint, peroneal tendons.  Remainder of musculoskeletal exam deferred.   Radiographs: 3 weightbearing views of the right foot were taken today.  These show avulsion fractures off of the calcaneus and cuboid.  There is increased osseous bridging to both fractures with maintenance of fracture line.  No change in displacement.  No other acute osseous findings.   Assessment:   1. Closed nondisplaced avulsion fracture of tuberosity of right calcaneus, initial encounter      Plan:  Patient was evaluated and treated and all questions answered.  Assessment and Plan Assessment & Plan Right calcaneus and cuboid avulsion fractures Minor avulsion fractures on the lateral aspect of the right calcaneus and cuboid. Tendons intact. Expected to heal without significant issues. - She is doing well in her recovery and pain is continuing to improve. - She feels that if she is walking all day, she is sore at the end of the day.  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  She is to be allowed intervals of rest as needed to elevate the extremity.  She has no formal restrictions. She was provided a work note.  - She is to return to clinic in 8 weeks for repeat evaluation with preclinical x-rays. - Pt is interested in physical therapy to aid with her recovery. Work on desensitization, work scientific laboratory technician, gait normalization.   Prentice Ovens, DPM "

## 2024-12-17 ENCOUNTER — Telehealth: Payer: Self-pay

## 2024-12-17 NOTE — Telephone Encounter (Signed)
 case mgr Harlene to see if PT order was placed for pt. At last visit she said Dr. DELENA Ovens mentioned, she just needs confirmation* WC.I adv her would chk with Dr. DELENA Ovens and call her back. She said she has left mess and no one has called her bk either way.

## 2024-12-17 NOTE — Addendum Note (Signed)
 Addended by: MAGDALEN BARTER on: 12/17/2024 01:53 PM   Modules accepted: Orders

## 2024-12-17 NOTE — Telephone Encounter (Signed)
 I adv Dr. DELENA Ovens that I would call Harlene back. She was frustrated no one had called her back and they have transferred her to my line.

## 2024-12-17 NOTE — Telephone Encounter (Signed)
 lft mess for Kathleen Doyle mgr WC- per Dr DELENA Ovens he will put in order for PT and they will contact the pt for scheduling.

## 2024-12-21 ENCOUNTER — Ambulatory Visit: Admitting: Physical Therapy

## 2024-12-21 ENCOUNTER — Other Ambulatory Visit: Payer: Self-pay

## 2024-12-21 ENCOUNTER — Encounter: Payer: Self-pay | Admitting: Physical Therapy

## 2024-12-21 DIAGNOSIS — M25571 Pain in right ankle and joints of right foot: Secondary | ICD-10-CM | POA: Diagnosis present

## 2024-12-21 DIAGNOSIS — S92034A Nondisplaced avulsion fracture of tuberosity of right calcaneus, initial encounter for closed fracture: Secondary | ICD-10-CM | POA: Diagnosis not present

## 2024-12-21 DIAGNOSIS — M6281 Muscle weakness (generalized): Secondary | ICD-10-CM | POA: Insufficient documentation

## 2024-12-21 NOTE — Therapy (Signed)
 " OUTPATIENT PHYSICAL THERAPY LOWER EXTREMITY EVALUATION  Patient Name: Kathleen Doyle MRN: [id number] DOB:Nov 21, 1976, 49 y.o., female Today's Date: 12/21/2024   PT End of Session - 12/21/24 0844     Visit Number 1    Date for Recertification  02/15/25    Authorization Type [employer] employee - [employer]    PT Start Time 0800    PT Stop Time 0840    PT Time Calculation (min) 40 min          Past Medical History:  Diagnosis Date   Allergy    Anemia    Asthma    as a child, no inhaler no problems as an adult    Blood transfusion without reported diagnosis    Dengue fever    hospitalized 2 weeks   Multiple gastric ulcers    Past Surgical History:  Procedure Laterality Date   COLONOSCOPY     RHINOPLASTY     Patient Active Problem List   Diagnosis Date Noted   History of colonic polyps 04/10/2021   Elevated blood-pressure reading without diagnosis of hypertension 03/12/2020   Mild intermittent asthma 03/12/2020   History of peptic ulcer 03/12/2020    PCP: Stephane Leita DEL, MD  REFERRING PROVIDER: Magdalen Prentice PARAS, DPM  THERAPY DIAG:  Pain in right ankle and joints of right foot - Plan: PT plan of care cert/re-cert  Muscle weakness - Plan: PT plan of care cert/re-cert  REFERRING DIAG:  Closed nondisplaced avulsion fracture of tuberosity of right calcaneus, initial encounter [S92.034A]   Rationale for Evaluation and Treatment:  Rehabilitation  SUBJECTIVE:  PERTINENT PAST HISTORY:  Avulsion fracture calcaneous and cuboid        PRECAUTIONS: None  WEIGHT BEARING RESTRICTIONS No  FALLS:  Has patient fallen in last 6 months? Yes, Number of falls: 1 resulting in injury  MOI/History of condition:  Onset date: 10/7  SUBJECTIVE STATEMENT  Pt is a 49 y.o. female who presents to clinic with chief complaint of R foot pain which started following slip and fall on 10/7.  Resulted in small avulsion fracture of cuboid and calcaneous.  Overall good improvement but  continues to have pain after walking for longer distances or work.  Pain:  Are you having pain? Yes Pain location: dorsal surface of R foot  NPRS scale:  Best: 0/10, Worst: 5/10 Aggravating factors: standing, walking (1-2 miles) Relieving factors: rest, elevation Pain description: aching  Occupation: Nurse, Adult: NA  Hand Dominance: NA  Patient Goals/Specific Activities: reduce pain   OBJECTIVE:   GENERAL OBSERVATION/GAIT:  Significant toe out bil L>R  SENSATION: Light touch: Appears intact  PALPATION: TTP R cuboid/calcaneal joint, TTP dorsal surface of R foot  LE MMT:  MMT Right (Eval) Left (Eval)  Hip flexion (L2, L3)    Knee extension (L3)    Knee flexion    Hip abduction    Hip extension    Hip external rotation    Hip internal rotation    Hip adduction    Ankle dorsiflexion (L4) 5 5  Ankle plantarflexion (S1) P! Unable to SL HR without significant pain 5  Ankle inversion 4 4+  Ankle eversion 4* 5  Great Toe ext (L5)    Grossly     (Blank rows = not tested, score listed is out of 5 possible points OR may be listed in lbs of force.  N = WNL, D = diminished, C = clear for gross weakness with myotome testing, * = concordant  pain with testing)  LE ROM:  ROM Right (Eval) Left (Eval)  Hip flexion    Hip extension    Hip abduction    Hip adduction    Hip internal rotation    Hip external rotation    Knee extension    Knee flexion    Ankle dorsiflexion 0 5  Ankle plantarflexion 40* 40  Ankle inversion 15* 20  Ankle eversion 40* 20   (Blank rows = not tested, N = WNL, * = concordant pain with testing)  Functional Tests  Eval                                                             PATIENT SURVEYS:  LEFS: 36/80  HOME EXERCISE PROGRAM: Access Code: [email or web address] URL: [email or web address] Date: 12/21/2024 Prepared by: Helene Gasmen  Program Notes start off 1x/day increase to 2x/day after 4 days if  there is no increase in pain  Exercises - Seated Toe Towel Scrunches  - 1 x daily - 7 x weekly - 1 sets - 2 reps - 1 minute hold - Seated Ankle Eversion with Resistance  - 1 x daily - 7 x weekly - 1 sets - 5 reps - 10 sec hold - Seated Figure 4 Ankle Inversion with Resistance  - 1 x daily - 7 x weekly - 1 sets - 5 reps - 10 sec hold  Treatment priorities   Eval        Start isometrics inv/eversion        Progressive plantar flexion flexion                                 TODAY'S TREATMENT:  Therapeutic Exercise: Creating, reviewing, and completing HEP   PATIENT EDUCATION ([facility]):  POC, diagnosis, prognosis, HEP, and outcome measures.  Pt educated via explanation, demonstration, and handout (HEP).  Pt confirms understanding verbally.   ASSESSMENT:  CLINICAL IMPRESSION: Kathleen Doyle is a 49 y.o. female who presents to clinic with signs and sxs consistent with R foot pain following avulsion fracture of lateral  calcaneous/cuboid on 10/7.   Continues to have dull pain with longer standing and walking activities.   Kathleen Doyle will benefit from skilled PT to address relevant deficits and improve comfort with daily tasks.   OBJECTIVE IMPAIRMENTS: Pain, R ankle strength, gait  ACTIVITY LIMITATIONS: walking, standing, squatting, lifting  PERSONAL FACTORS: See medical history and pertinent history   REHAB POTENTIAL: Good  CLINICAL DECISION MAKING: Evolving/moderate complexity  EVALUATION COMPLEXITY: Moderate   GOALS:   SHORT TERM GOALS: Target date: 01/18/2025   Kathleen Doyle will be >75% HEP compliant to improve carryover between sessions and facilitate independent management of condition  Evaluation: ongoing Goal status: INITIAL   LONG TERM GOALS: Target date: 02/15/2025   Kathleen Doyle will self report >/= 50% decrease in pain from evaluation to improve function in daily tasks  Evaluation/Baseline: 5/10 max pain Goal status: INITIAL   2.  Kathleen Doyle will show a >/= 14 pt improvement in  LEFS score (MCID is ~11% or 9 pts) as a proxy for functional improvement   Evaluation/Baseline: 36/80 pts Goal status: INITIAL   3.  Kathleen Doyle will be able to walk her dog and work, not limited by pain  Evaluation/Baseline: limited Goal status: INITIAL   4.  Kathleen Doyle will be able to perform at least partial ROM R SL heel raise, not limited by pain  Evaluation/Baseline: limited Goal status: INITIAL    PLAN: PT FREQUENCY: 1-2x/week  PT DURATION: 8 weeks  PLANNED INTERVENTIONS:  [id number]- PT Re-evaluation, 97110-Therapeutic exercises, 97530- Therapeutic activity, [id number]- Neuromuscular re-education, 97535- Self Care, [id number]- Manual therapy, [id number]- Gait training, [id number]- Aquatic Therapy, [phone]- Electrical stimulation (manual), [id number]- Vasopneumatic device, [id number]- Traction (mechanical), [id number]- Ionotophoresis 4mg /ml Dexamethasone, Taping, Dry Needling, Joint manipulation, and Spinal manipulation.   Helene BRAVO Stpehanie Montroy PT 12/21/2024, 8:47 AM  "

## 2024-12-25 ENCOUNTER — Inpatient Hospital Stay: Admission: RE | Admit: 2024-12-25 | Source: Ambulatory Visit

## 2024-12-26 ENCOUNTER — Ambulatory Visit: Admitting: Physical Therapy

## 2024-12-27 ENCOUNTER — Ambulatory Visit
Admission: RE | Admit: 2024-12-27 | Discharge: 2024-12-27 | Disposition: A | Discharge status: Home / Self Care | Source: Ambulatory Visit | Attending: Internal Medicine | Admitting: Internal Medicine

## 2024-12-27 DIAGNOSIS — Z1231 Encounter for screening mammogram for malignant neoplasm of breast: Secondary | ICD-10-CM

## 2025-01-02 ENCOUNTER — Ambulatory Visit

## 2025-01-02 DIAGNOSIS — M25571 Pain in right ankle and joints of right foot: Secondary | ICD-10-CM

## 2025-01-02 DIAGNOSIS — M6281 Muscle weakness (generalized): Secondary | ICD-10-CM

## 2025-01-09 ENCOUNTER — Ambulatory Visit: Admitting: Physical Therapy

## 2025-01-16 ENCOUNTER — Ambulatory Visit: Admitting: Physical Therapy

## 2025-01-23 ENCOUNTER — Ambulatory Visit: Admitting: Physical Therapy

## 2025-02-01 ENCOUNTER — Ambulatory Visit
